# Patient Record
Sex: Female | Born: 1935 | Race: Black or African American | Hispanic: No | Smoking: Never smoker
Health system: Southern US, Community
[De-identification: ages and names within clinical notes are randomized; demographics above are authoritative.]

## PROBLEM LIST (undated history)

## (undated) DIAGNOSIS — R001 Bradycardia, unspecified: Secondary | ICD-10-CM

## (undated) DIAGNOSIS — R4189 Other symptoms and signs involving cognitive functions and awareness: Secondary | ICD-10-CM

## (undated) DIAGNOSIS — R404 Transient alteration of awareness: Secondary | ICD-10-CM

## (undated) DIAGNOSIS — E860 Dehydration: Secondary | ICD-10-CM

## (undated) DIAGNOSIS — F0390 Unspecified dementia without behavioral disturbance: Secondary | ICD-10-CM

## (undated) HISTORY — DX: Unspecified dementia, unspecified severity, without behavioral disturbance, psychotic disturbance, mood disturbance, and anxiety: F03.90

## (undated) HISTORY — DX: Dehydration: E86.0

## (undated) HISTORY — DX: Transient alteration of awareness: R40.4

## (undated) HISTORY — DX: Other symptoms and signs involving cognitive functions and awareness: R41.89

## (undated) HISTORY — DX: Bradycardia, unspecified: R00.1

---

## 2014-06-01 DIAGNOSIS — E7800 Pure hypercholesterolemia, unspecified: Secondary | ICD-10-CM | POA: Insufficient documentation

## 2014-06-01 DIAGNOSIS — I10 Essential (primary) hypertension: Secondary | ICD-10-CM | POA: Insufficient documentation

## 2014-09-07 DIAGNOSIS — F0390 Unspecified dementia without behavioral disturbance: Secondary | ICD-10-CM | POA: Insufficient documentation

## 2014-09-07 DIAGNOSIS — R630 Anorexia: Secondary | ICD-10-CM | POA: Insufficient documentation

## 2014-10-19 DIAGNOSIS — J301 Allergic rhinitis due to pollen: Secondary | ICD-10-CM | POA: Insufficient documentation

## 2015-06-21 DIAGNOSIS — F419 Anxiety disorder, unspecified: Secondary | ICD-10-CM | POA: Insufficient documentation

## 2016-07-18 DIAGNOSIS — E559 Vitamin D deficiency, unspecified: Secondary | ICD-10-CM | POA: Insufficient documentation

## 2018-05-06 DIAGNOSIS — D649 Anemia, unspecified: Secondary | ICD-10-CM | POA: Insufficient documentation

## 2018-12-23 ENCOUNTER — Other Ambulatory Visit: Payer: Self-pay

## 2018-12-23 ENCOUNTER — Ambulatory Visit (INDEPENDENT_AMBULATORY_CARE_PROVIDER_SITE_OTHER): Payer: Medicare Other | Admitting: Nurse Practitioner

## 2018-12-23 ENCOUNTER — Encounter: Payer: Self-pay | Admitting: Nurse Practitioner

## 2018-12-23 VITALS — BP 124/62 | HR 56 | Temp 98.2°F | Ht 60.0 in | Wt 133.0 lb

## 2018-12-23 DIAGNOSIS — R001 Bradycardia, unspecified: Secondary | ICD-10-CM | POA: Diagnosis not present

## 2018-12-23 DIAGNOSIS — F419 Anxiety disorder, unspecified: Secondary | ICD-10-CM | POA: Diagnosis not present

## 2018-12-23 DIAGNOSIS — G301 Alzheimer's disease with late onset: Secondary | ICD-10-CM | POA: Diagnosis not present

## 2018-12-23 DIAGNOSIS — F0281 Dementia in other diseases classified elsewhere with behavioral disturbance: Secondary | ICD-10-CM

## 2018-12-23 DIAGNOSIS — Z66 Do not resuscitate: Secondary | ICD-10-CM | POA: Diagnosis not present

## 2018-12-23 NOTE — Progress Notes (Signed)
Careteam: Patient Care Team: Lauree Chandler, NP as PCP - General (Geriatric Medicine)  Advanced Directive information    Allergies  Allergen Reactions  . Penicillins     Chief Complaint  Patient presents with  . Establish Care    New Patient Establish Care. Here with daughter, [name]. Patient resides with daughter, previously in a senior living center      HPI: Patient is a 85 y.o. female seen in the office today to establish care.  Moved from [facility] to [place] to be closer to other family members. Daughter decided to take her mother out of a long term care facility.  Previously Seeing a NP at [facility].   Every 3-4 months she would have unresponsive episode, felt like it was heart related. In January she had a huge workup that did not reveal anything. Due to dementia family wants comfort approach and not aggressive measures at this time. She had been referred to palliative care but then COVID hit Started MOST Form but did not complete. She is a DNR  Dementia with behaviors- uses Seroquel 50 mg by mouth daily at bedtime which helps her sleep. Recently was increased to help with sleep. Increase behaviors at night with having to go to the bathroom.   Bradycardia- noted during hospitalization  Anxiety- ongoing, uses xanax and Seroquel to help her sleep.   pts son is a physician and told daughter to give Seroquel and alprazolam   Good bowel movements.  Eating well however does not have dentures. Daughter grinds meet and having a hard time with vegetable does not like them.    Review of Systems:  Review of Systems  Unable to perform ROS: Dementia    Past Medical History:  Diagnosis Date  . Bradycardia   . Dehydration   . Dementia ([place])   . Unresponsive episode    History reviewed. No pertinent surgical history. Social History:   reports that she has never smoked. She has never used smokeless tobacco. She reports that she does not drink alcohol or use  drugs.  History reviewed. No pertinent family history.  Medications: Patient's Medications  New Prescriptions   No medications on file  Previous Medications   ACETAMINOPHEN (TYLENOL) 500 MG TABLET    Take 500 mg by mouth as needed.   ALPRAZOLAM (XANAX) 0.25 MG TABLET    Take 0.25 mg by mouth daily.   MAGNESIUM HYDROXIDE (MILK OF MAGNESIA) 400 MG/5ML SUSPENSION    Take by mouth as needed for mild constipation.   QUETIAPINE (SEROQUEL) 50 MG TABLET    Take 50 mg by mouth at bedtime.  Modified Medications   No medications on file  Discontinued Medications   No medications on file    Physical Exam:  Vitals:   12/23/18 1319  BP: 124/62  Pulse: (!) 56  Temp: 98.2 F (36.8 C)  TempSrc: Oral  SpO2: 98%  Weight: 133 lb (60.3 kg)  Height: 5' (1.524 m)   Body mass index is 25.97 kg/m. Wt Readings from Last 3 Encounters:  12/23/18 133 lb (60.3 kg)    Physical Exam Constitutional:      General: She is not in acute distress.    Appearance: She is well-developed. She is not diaphoretic.     Comments: Frail female  HENT:     Head: Normocephalic and atraumatic.  Eyes:     Conjunctiva/sclera: Conjunctivae normal.     Pupils: Pupils are equal, round, and reactive to light.  Neck:     Musculoskeletal: Normal range of motion and neck supple.  Cardiovascular:     Rate and Rhythm: Normal rate and regular rhythm.     Heart sounds: Normal heart sounds.  Pulmonary:     Effort: Pulmonary effort is normal.     Breath sounds: Normal breath sounds.  Abdominal:     General: Bowel sounds are normal.     Palpations: Abdomen is soft.  Musculoskeletal:        General: No tenderness.  Skin:    General: Skin is warm and dry.  Neurological:     General: No focal deficit present.     Mental Status: She is alert. Mental status is at baseline. She is disoriented.  Psychiatric:        Cognition and Memory: Cognition is impaired. Memory is impaired. She exhibits impaired recent memory and  impaired remote memory.     Labs reviewed: Basic Metabolic Panel: No results for input(s): NA, K, CL, CO2, GLUCOSE, BUN, CREATININE, CALCIUM, MG, PHOS, TSH in the last 8760 hours. Liver Function Tests: No results for input(s): AST, ALT, ALKPHOS, BILITOT, PROT, ALBUMIN in the last 8760 hours. No results for input(s): LIPASE, AMYLASE in the last 8760 hours. No results for input(s): AMMONIA in the last 8760 hours. CBC: No results for input(s): WBC, NEUTROABS, HGB, HCT, MCV, PLT in the last 8760 hours. Lipid Panel: No results for input(s): CHOL, HDL, LDLCALC, TRIG, CHOLHDL, LDLDIRECT in the last 8760 hours. TSH: No results for input(s): TSH in the last 8760 hours. A1C: No results found for: HGBA1C   Assessment/Plan 1. Bradycardia -noted, had extensive work up earlier this year and per daughter without significant findings. Pt is comfort care at this time and would not want another work up like this. - Amb Referral to Palliative Care  2. DNR (do not resuscitate) - DNR (Do Not Resuscitate) -most form completed - Amb Referral to Palliative Care  3. Late onset Alzheimer's disease with behavioral disturbance ([place]) Stable, living with daughter who is providing total care. She request FMLA paperwork to be completed as she plans to take a leave of absence to take care of her mother.  Continues on Seroquel For behaviors and alprazolam as needed for increase anxiety in the evenings.  - Amb Referral to Palliative Care  Next appt: 6 months.  Carlos American. [place], [facility] [phone]

## 2018-12-24 ENCOUNTER — Telehealth: Payer: Self-pay | Admitting: Internal Medicine

## 2018-12-24 ENCOUNTER — Telehealth: Payer: Self-pay

## 2018-12-24 NOTE — Telephone Encounter (Signed)
Stacy from [facility] called to confirm the referral that was sent is for Palliative care and not Hospice. I confirmed with Marzetta Board that the referral was for palliative care

## 2018-12-24 NOTE — Telephone Encounter (Signed)
Spoke with patient's daughter Terri Green to schedule Palliative consult and she wanted to call me back around 2 pm to do this.

## 2018-12-25 NOTE — Telephone Encounter (Signed)
12/25/18 @ 2 PM:  Called daughter back and we have scheduled a [place] for 12/28/18 @ 11:30 AM.

## 2018-12-28 ENCOUNTER — Encounter: Payer: Self-pay | Admitting: Nurse Practitioner

## 2018-12-28 ENCOUNTER — Other Ambulatory Visit: Payer: Medicare Other | Admitting: Internal Medicine

## 2018-12-28 ENCOUNTER — Other Ambulatory Visit: Payer: Self-pay

## 2018-12-28 DIAGNOSIS — Z515 Encounter for palliative care: Secondary | ICD-10-CM

## 2018-12-28 NOTE — Progress Notes (Signed)
    [facility] Consult Note Telephone: [phone]  Fax: [phone]  PATIENT NAME: Terri Green DOB: June 18, 1926 MRN: [id number]  PRIMARY CARE PROVIDER:   Lauree Chandler, NP  REFERRING PROVIDER:  Lauree Chandler, NP [place],  [place] [address]  RESPONSIBLE PARTY:   Uilani Sanville (daughter) [phone]     RECOMMENDATIONS and PLAN:  Palliative Care Encounter  Z51.5  1.  Memory loss with behaviors: FAST stage 6c. Sundowning behaviors are managed with use of Seroquel 25 and 50mg   and Alprazolam 0.25mg  at HS.  Continue to monitor for occurrences of additional behaviors which may require adjustment or addition of meds.  Supportive care by daughter and family.  2. Bradycardia:  Per history.  Encouraged hesitation prior to standing or ambulation.  Good bowel program to prevent constipation and straining with bowel movements. Prevention of syncopal episodes explained.  No plans for additional evaluations.    3. Advanced Care Planning:  Reviewed Palliative and Hospice care to daughter( pt. Is unable to make decisions for self related to cognitive decline) Goals of care reviewed which include comfort and safe care at daughter's home.  No desire for escalation of care.  DNAR and MOST form selections confirmed. DNR for in the home and MOST form will be provided to daughter for her records and posting. (Comfort care, No IV fluids, Determine use of antibiotics and no feeding tube). Follow patient trajectory. Questions answered.  Palliative care will follow-up in aprox 1 month.   Due to the COVID-19 crisis, this visit occurred via Telehealth from my office and was initiated by the patient and or family.    I spent 60 minutes providing this consultation,  from 1130 to 1230.  More than 50% of the time in this consultation was spent coordinating communication with patient and daugter.   HISTORY OF PRESENT ILLNESS:  Terri Green  is a 85 y.o. year old female with multiple medical problems including bradycardia with syncope and dementia.  She was relocated to her POA/daughter's home in June 2020 from a facility.  Daughter reports that she is able to feed herself, is ambulatory with use of a walker and is normally continent. Weight is steady at 133# 5'.  She requires assistance with her ADLs and some coaxing with activities.  She normally becomes more confused beginning in the early evening and may "fuss" but is not physically aggressive. Palliative Care was asked to help address goals of care.   CODE STATUS: DNAR/DNI  PPS: 50% HOSPICE ELIGIBILITY/DIAGNOSIS: TBD  PAST MEDICAL HISTORY:  Past Medical History:  Diagnosis Date  . Bradycardia   . Dehydration   . Dementia ([place])   . Unresponsive episode     PERTINENT MEDICATIONS:  Outpatient Encounter Medications as of 12/28/2018  Medication Sig  . acetaminophen (TYLENOL) 500 MG tablet Take 500 mg by mouth as needed.  . ALPRAZolam (XANAX) 0.25 MG tablet Take 0.25 mg by mouth daily.  . magnesium hydroxide (MILK OF MAGNESIA) 400 MG/5ML suspension Take by mouth as needed for mild constipation.  . QUEtiapine (SEROQUEL) 50 MG tablet Take 50 mg by mouth at bedtime.   No facility-administered encounter medications on file as of 12/28/2018.     PHYSICAL EXAM:   General: Elderly female sitting in chair.  In NAD Pulmonary: No increased respiratory effort Neurological: Alert and conversive in brief sentences.  Follows commands.  Psych:  Non-agitated.  Gonzella Lex, NP-C

## 2018-12-29 ENCOUNTER — Encounter: Payer: Self-pay | Admitting: Nurse Practitioner

## 2018-12-30 NOTE — Telephone Encounter (Signed)
Message routed to [place], Jessica K, NP  

## 2019-01-01 ENCOUNTER — Ambulatory Visit (INDEPENDENT_AMBULATORY_CARE_PROVIDER_SITE_OTHER): Payer: Medicare Other | Admitting: Nurse Practitioner

## 2019-01-01 ENCOUNTER — Encounter: Payer: Self-pay | Admitting: Nurse Practitioner

## 2019-01-01 ENCOUNTER — Telehealth: Payer: Self-pay

## 2019-01-01 ENCOUNTER — Other Ambulatory Visit: Payer: Self-pay

## 2019-01-01 DIAGNOSIS — F419 Anxiety disorder, unspecified: Secondary | ICD-10-CM

## 2019-01-01 MED ORDER — SERTRALINE HCL 50 MG PO TABS: ORAL_TABLET | ORAL | 0 refills | Status: DC

## 2019-01-01 NOTE — Telephone Encounter (Signed)
Dee called the pharmacy to see when the rx was last filled and who sent it in, patient had 2 refills left from previous provider.  Per Janett Billow I called the patient back and informed her she had 2 refills left for Xanax 0.5 mg. Janett Billow instructed for patient to cut pill in half to equal 0.25 mg.  Discussed the above with patients daughter and she verbalized her understanding.

## 2019-01-01 NOTE — Progress Notes (Signed)
This service is provided via telemedicine  No vital signs collected/recorded due to the encounter was a telemedicine visit.   Location of patient (ex: home, work):  Home  Patient consents to a telephone visit:  Yes  Location of the provider (ex: office, home):  Office  Name of any referring provider:  N/A  Names of all persons participating in the telemedicine service and their role in the encounter:  [name] daughter, patient, Marisa Cyphers RMA, and Sherrie Mustache NP  Time spent on call:  10 min

## 2019-01-01 NOTE — Patient Instructions (Addendum)
Start zoloft 25 mg (1/2 tablet) by mouth daily for anxiety- after 2 weeks to increase to zoloft 50 mg (1 tablet) daily   To use xanax 0.25 mg as needed for anxiety   To use Seroquel 50 mg by mouth at bedtime

## 2019-01-01 NOTE — Progress Notes (Signed)
Careteam: Patient Care Team: Lauree Chandler, NP as PCP - General (Geriatric Medicine)  Advanced Directive information Does Patient Have a Medical Advance Directive?: Yes  Allergies  Allergen Reactions  . Penicillins     Chief Complaint  Patient presents with  . Acute Visit    discuss medication and possible medication change     HPI: Patient is a 85 y.o. female via tele-visit.  Daughter reports she is having more anxiety about the bathroom. She gets agitated and wants to go to the bathroom all the time. Every time she goes to the bathroom she goes. She does not wet herself or have any accidents between times. She goes every 3 hour per daughters routine.  Daughter feels like this is her anxiety - she is currently on xanax 0.25 mg by mouth daily at bedtime.  She takes Seroquel 50 mg during the day.  Her xanax 0.25 mg was last filled 11/13/2018- she upped dose for a few weeks per her old PCP but now needs refill for previous dosing.  Seroquel 50 mg at bedtime has 1 refill left   Review of Systems:  Review of Systems  Unable to perform ROS: Dementia    Past Medical History:  Diagnosis Date  . Bradycardia   . Dehydration   . Dementia ([place])   . Unresponsive episode    History reviewed. No pertinent surgical history. Social History:   reports that she has never smoked. She has never used smokeless tobacco. She reports that she does not drink alcohol or use drugs.  History reviewed. No pertinent family history.  Medications: Patient's Medications  New Prescriptions   No medications on file  Previous Medications   ACETAMINOPHEN (TYLENOL) 500 MG TABLET    Take 500 mg by mouth as needed.   ALPRAZOLAM (XANAX) 0.25 MG TABLET    Take 0.25 mg by mouth daily.   MAGNESIUM HYDROXIDE (MILK OF MAGNESIA) 400 MG/5ML SUSPENSION    Take by mouth as needed for mild constipation.   QUETIAPINE (SEROQUEL) 50 MG TABLET    Take 50 mg by mouth at bedtime.  Modified Medications   No  medications on file  Discontinued Medications   No medications on file    Physical Exam:  There were no vitals filed for this visit. There is no height or weight on file to calculate BMI. Wt Readings from Last 3 Encounters:  12/23/18 133 lb (60.3 kg)     Labs reviewed: Basic Metabolic Panel: No results for input(s): NA, K, CL, CO2, GLUCOSE, BUN, CREATININE, CALCIUM, MG, PHOS, TSH in the last 8760 hours. Liver Function Tests: No results for input(s): AST, ALT, ALKPHOS, BILITOT, PROT, ALBUMIN in the last 8760 hours. No results for input(s): LIPASE, AMYLASE in the last 8760 hours. No results for input(s): AMMONIA in the last 8760 hours. CBC: No results for input(s): WBC, NEUTROABS, HGB, HCT, MCV, PLT in the last 8760 hours. Lipid Panel: No results for input(s): CHOL, HDL, LDLCALC, TRIG, CHOLHDL, LDLDIRECT in the last 8760 hours. TSH: No results for input(s): TSH in the last 8760 hours. A1C: No results found for: HGBA1C   Assessment/Plan 1. Anxiety -increase in anxiety noted, using xanax daily, will start zoloft for better control. To use xanax as needed. -after visit we called the pharmacy and she has 2 refills on fil from previous provider. CMA has notified daughter that these refills were on file and she can cut tablets in half to equal 0.25 mg dosing.  - sertraline (  ZOLOFT) 50 MG tablet; 1/2 tablet by mouth daily for 2 weeks then to increase to 1 tablet  Dispense: 30 tablet; Refill: 0 - BMP with eGFR([facility]); Future  2. Dementia with behaviors Stable, has behaviors and has been stable on seroquel. Continue at bedtime.  Next appt: 4 weeks for follow up and BMP Rayssa Atha K. Sireen Halk, [facility] [phone]    Virtual Visit via Telephone Note  I connected with pt and daughter on 01/01/19 at  2:45 PM EDT by telephone and verified that I am speaking with the correct person using two identifiers.  Location: Patient: home Provider: office    I discussed the limitations, risks, security and privacy concerns of performing an evaluation and management service by telephone and the availability of in person appointments. I also discussed with the patient that there may be a patient responsible charge related to this service. The patient expressed understanding and agreed to proceed.   I discussed the assessment and treatment plan with the patient. The patient was provided an opportunity to ask questions and all were answered. The patient agreed with the plan and demonstrated an understanding of the instructions.   The patient was advised to call back or seek an in-person evaluation if the symptoms worsen or if the condition fails to improve as anticipated.  I provided 12 minutes of non-face-to-face time during this encounter.  Carlos American. Harle Battiest Avs printed and mailed

## 2019-01-04 ENCOUNTER — Other Ambulatory Visit: Payer: Medicare Other

## 2019-02-02 ENCOUNTER — Other Ambulatory Visit: Payer: Self-pay

## 2019-02-02 ENCOUNTER — Encounter: Payer: Self-pay | Admitting: Nurse Practitioner

## 2019-02-02 ENCOUNTER — Other Ambulatory Visit: Payer: Medicare Other | Admitting: Internal Medicine

## 2019-02-02 ENCOUNTER — Other Ambulatory Visit: Payer: Self-pay | Admitting: Nurse Practitioner

## 2019-02-02 DIAGNOSIS — F419 Anxiety disorder, unspecified: Secondary | ICD-10-CM

## 2019-02-02 MED ORDER — QUETIAPINE FUMARATE 50 MG PO TABS: 50.0000 mg | ORAL_TABLET | Freq: Every day | ORAL | 1 refills | Status: DC

## 2019-02-02 NOTE — Telephone Encounter (Deleted)
Please disregard telephone encounter note created at 2:57 pm about alprazolam refill. Patient states they do not need refill yet.

## 2019-02-02 NOTE — Telephone Encounter (Deleted)
Verified in [facility] database last refill was 12/11/18 and fill date was 01/12/2019 for 30 tablets total of alprazolam 0.5 mg. Next appointment is 02/15/2019 with Janett Billow and last appointment was 01/01/2019. Patient is requesting refill.

## 2019-02-09 ENCOUNTER — Ambulatory Visit (INDEPENDENT_AMBULATORY_CARE_PROVIDER_SITE_OTHER): Payer: Medicare Other | Admitting: Nurse Practitioner

## 2019-02-09 ENCOUNTER — Ambulatory Visit: Payer: Medicare Other | Admitting: Nurse Practitioner

## 2019-02-09 ENCOUNTER — Encounter: Payer: Self-pay | Admitting: Nurse Practitioner

## 2019-02-09 ENCOUNTER — Other Ambulatory Visit: Payer: Self-pay

## 2019-02-09 DIAGNOSIS — F419 Anxiety disorder, unspecified: Secondary | ICD-10-CM | POA: Diagnosis not present

## 2019-02-09 DIAGNOSIS — G4700 Insomnia, unspecified: Secondary | ICD-10-CM | POA: Diagnosis not present

## 2019-02-09 MED ORDER — ALPRAZOLAM 0.25 MG PO TABS: 0.2500 mg | ORAL_TABLET | Freq: Every day | ORAL | Status: DC | PRN

## 2019-02-09 NOTE — Progress Notes (Signed)
This service is provided via telemedicine  No vital signs collected/recorded due to the encounter was a telemedicine visit.   Location of patient (ex: home, work):  Home  Patient consents to a telephone visit:  Yes  Location of the provider (ex: office, home):  Office.  Name of any referring provider:  Sherrie Mustache, NP  Names of all persons participating in the telemedicine service and their role in the encounter:  Sherrie Mustache, NP; Bonney Leitz, [place];  [name], daughter  Time spent on call:  6.47  Time for CMA only.      Careteam: Patient Care Team: Lauree Chandler, NP as PCP - General (Geriatric Medicine)  Advanced Directive information Does Patient Have a Medical Advance Directive?: Yes, Type of Advance Directive: Living will, Does patient want to make changes to medical advance directive?: No - Guardian declined  Allergies  Allergen Reactions  . Penicillins     Chief Complaint  Patient presents with  . Follow-up    70-month followup for anxiety     HPI: Patient is a 85 y.o. female for follow up on anxiety.  Daughter on call providing information.  Pt with dementia.   At last visit she was having increase in anxiety and zoloft was started. Feels like anxiety is some better since starting.  She started zoloft 25 mg for 2 weeks and then increase 50 mg daily.  She is still getting xanax at bedtime to help with sleep.  Feel like prior to starting xanax she was only getting 3-4 hours of sleep. Now that she is on xanax with Seroquel she gets better sleep 3-4 nights out of the week.  Lots of anxiety due to overactive bladder  Insomnia- unless she is worn out she does not sleep. Uses xanax and Seroquel nightly for sleep. Goes to bed at 9:20, gets up at 4:20 to go to the bathroom. Went back to sleep after 2 hours.   Review of Systems:  Review of Systems  Unable to perform ROS: Dementia    Past Medical History:  Diagnosis Date  . Bradycardia    . Dehydration   . Dementia ([place])   . Unresponsive episode    History reviewed. No pertinent surgical history. Social History:   reports that she has never smoked. She has never used smokeless tobacco. She reports that she does not drink alcohol or use drugs.  History reviewed. No pertinent family history.  Medications: Patient's Medications  New Prescriptions   No medications on file  Previous Medications   ACETAMINOPHEN (TYLENOL) 500 MG TABLET    Take 500 mg by mouth as needed.   ALPRAZOLAM (XANAX) 0.25 MG TABLET    Take 0.25 mg by mouth daily.   MAGNESIUM HYDROXIDE (MILK OF MAGNESIA) 400 MG/5ML SUSPENSION    Take by mouth as needed for mild constipation.   QUETIAPINE (SEROQUEL) 50 MG TABLET    Take 1 tablet (50 mg total) by mouth at bedtime.   SERTRALINE (ZOLOFT) 50 MG TABLET    Take 1 tablet (50 mg total) by mouth daily.  Modified Medications   No medications on file  Discontinued Medications   No medications on file    Physical Exam:  There were no vitals filed for this visit. There is no height or weight on file to calculate BMI. Wt Readings from Last 3 Encounters:  12/23/18 133 lb (60.3 kg)      Labs reviewed: Basic Metabolic Panel: No results for input(s): NA, K, CL, CO2,  GLUCOSE, BUN, CREATININE, CALCIUM, MG, PHOS, TSH in the last 8760 hours. Liver Function Tests: No results for input(s): AST, ALT, ALKPHOS, BILITOT, PROT, ALBUMIN in the last 8760 hours. No results for input(s): LIPASE, AMYLASE in the last 8760 hours. No results for input(s): AMMONIA in the last 8760 hours. CBC: No results for input(s): WBC, NEUTROABS, HGB, HCT, MCV, PLT in the last 8760 hours. Lipid Panel: No results for input(s): CHOL, HDL, LDLCALC, TRIG, CHOLHDL, LDLDIRECT in the last 8760 hours. TSH: No results for input(s): TSH in the last 8760 hours. A1C: No results found for: HGBA1C   Assessment/Plan 1. Anxiety -ongoing. Feeling like she is benefiting from zoloft. No side effects  notedWill continue at zoloft 50 mg by mouth daily and have daughter titrate routine xanax use at bedtime. Can use as needed for worsening anxiety but not recommended for sleep maintenance  - ALPRAZolam (XANAX) 0.25 MG tablet; Take 1 tablet (0.25 mg total) by mouth daily as needed for anxiety.  Dispense: 30 tablet  2. Insomnia, unspecified type -ongoing issues with waking up in the middle of the night. Discussed that alprazolam was not recommended for sleep maintenance. Recommended to titration off daily use. To use melatonin 3-6 mg by mouth at bedtime. Avoid fluid late in the day.  -to continue bedtime routine, may need to push back bedtime and have more activity during the day.   Next appt: as scheduled. 06/25/2019, sooner if needed Carlos American. Harle Battiest  [facility] [phone]   Virtual Visit via Telephone Note  I connected with pt and daughter on 02/09/19 at 11:00 AM EDT by telephone and verified that I am speaking with the correct person using two identifiers.  Location: Patient: home Provider: office   I discussed the limitations, risks, security and privacy concerns of performing an evaluation and management service by telephone and the availability of in person appointments. I also discussed with the patient that there may be a patient responsible charge related to this service. The patient expressed understanding and agreed to proceed.   I discussed the assessment and treatment plan with the patient. The patient was provided an opportunity to ask questions and all were answered. The patient agreed with the plan and demonstrated an understanding of the instructions.   The patient was advised to call back or seek an in-person evaluation if the symptoms worsen or if the condition fails to improve as anticipated.  I provided 18 minutes of non-face-to-face time during this encounter.  Carlos American. [name], AGNP Avs offer to be printed and mailed but  daughter declined, states she will look up visit and AVS on mychart.

## 2019-02-09 NOTE — Telephone Encounter (Signed)
Message routed to Lauree Chandler, NP  Please add recommended melatonin dose to medication list also

## 2019-02-09 NOTE — Patient Instructions (Addendum)
To wean off xanax, recommend to take 1/2 tablet at night for 3 days then decrease to half tablet every other day for 3 doses then stop routinely at bedtime. Can still use as needed.   START melatonin 3-6 mg by mouth at bedtime every night around the same in the evening.   Push back bedtime More activity during the day

## 2019-02-15 ENCOUNTER — Ambulatory Visit: Payer: Medicare Other | Admitting: Nurse Practitioner

## 2019-02-19 ENCOUNTER — Encounter: Payer: Self-pay | Admitting: Nurse Practitioner

## 2019-03-03 ENCOUNTER — Encounter: Payer: Self-pay | Admitting: Nurse Practitioner

## 2019-03-03 DIAGNOSIS — F419 Anxiety disorder, unspecified: Secondary | ICD-10-CM

## 2019-03-03 DIAGNOSIS — G4700 Insomnia, unspecified: Secondary | ICD-10-CM

## 2019-03-03 NOTE — Telephone Encounter (Signed)
Message forwarded to Lauree Chandler, NP

## 2019-03-04 ENCOUNTER — Other Ambulatory Visit: Payer: Self-pay

## 2019-03-04 ENCOUNTER — Ambulatory Visit (INDEPENDENT_AMBULATORY_CARE_PROVIDER_SITE_OTHER): Payer: Medicare Other | Admitting: *Deleted

## 2019-03-04 VITALS — BP 120/64 | HR 68 | Temp 98.6°F | Resp 18

## 2019-03-04 DIAGNOSIS — Z23 Encounter for immunization: Secondary | ICD-10-CM

## 2019-03-08 MED ORDER — ALPRAZOLAM 0.25 MG PO TABS: 0.2500 mg | ORAL_TABLET | Freq: Every day | ORAL | 1 refills | Status: DC | PRN

## 2019-03-08 NOTE — Telephone Encounter (Signed)
RX pending

## 2019-03-08 NOTE — Telephone Encounter (Signed)
Can you call pts daughter for clarification- We will continue to fill the xanax, is she needed a refill now? I do not believe it is due at this time.

## 2019-03-23 ENCOUNTER — Other Ambulatory Visit: Payer: Self-pay

## 2019-03-23 ENCOUNTER — Other Ambulatory Visit: Payer: Medicare Other | Admitting: Internal Medicine

## 2019-03-23 DIAGNOSIS — Z515 Encounter for palliative care: Secondary | ICD-10-CM

## 2019-03-23 NOTE — Progress Notes (Signed)
    [facility] Consult Note Telephone: [phone]  Fax: [phone]  PATIENT NAME: Terri Green DOB: 1926-08-07 MRN: [id number]  PRIMARY CARE PROVIDER:   Lauree Chandler, NP  REFERRING PROVIDER:  Lauree Chandler, NP [place],  [place] [address]  RESPONSIBLE PARTY:   Sniyah Krzywicki (daughter) [phone]    RECOMMENDATIONS and PLAN:  Palliative Care Encounter  Z51.5  1. Advanced Care Planning: Goals remain unchanged.  Pt. Will continue to live with daughter and receive all assistance from family members.    Primary goal is to avoid hospitalization.  DNAR and MOST forms are in the home. Follow patient trajectory and advise accordingly. Palliative care will follow-up in aprox 1 month.  2.  Memory loss with behaviors: FAST stage 6d and at baseline.  Improved sundowning with use of Zoloft, Xanax and Seroquel.  Supportive care by daughter and family.  Tolerating home environment well. Consider adult day care in the future. Continue to monitor.  3.  Urinary frequency:  Add Cranberry juice or pills daily.  Appropriate hydration.  Monitor for any acute behavior changes and assess UA prn.    Due to the COVID-19 crisis, this visit occurred via Telehealth from my office and was initiated and consented by the patient and or family.    I spent 30 minutes providing this consultation,  from 1000 to 1030.  More than 50% of the time in this consultation was spent coordinating communication with patient and daugter.   HISTORY OF PRESENT ILLNESS:  Follow-up with Denny Peon   Daughter reports that patient continues to eat well and is sleeping better in the evening. She denies any recent illnesses or falls.  She remains ambulatory with assist of a walker and is able to feed self.  Palliative Care was asked to help address goals of care.   CODE STATUS: DNAR/DNI  PPS: 50% HOSPICE ELIGIBILITY/DIAGNOSIS: TBD  PAST MEDICAL  HISTORY:  Past Medical History:  Diagnosis Date  . Bradycardia   . Dehydration   . Dementia ([place])   . Unresponsive episode     PHYSICAL EXAM:   General: Well nourished elderly femae in NAD Pulmonary: No increased respiratory effort Neurological: Alert and conversive in brief sentences.  Follows commands. Unable to determine orientation due to cognitive deficits.   Looks to daughter for assistance in answering questions.  Psych:  Non-agitated.  Cooperative  Gonzella Lex, NP-C

## 2019-04-27 ENCOUNTER — Other Ambulatory Visit: Payer: Medicare Other | Admitting: Internal Medicine

## 2019-05-03 ENCOUNTER — Encounter: Payer: Self-pay | Admitting: Nurse Practitioner

## 2019-05-03 DIAGNOSIS — F419 Anxiety disorder, unspecified: Secondary | ICD-10-CM

## 2019-05-03 DIAGNOSIS — G4700 Insomnia, unspecified: Secondary | ICD-10-CM

## 2019-05-03 MED ORDER — ALPRAZOLAM 0.25 MG PO TABS: 0.2500 mg | ORAL_TABLET | Freq: Every day | ORAL | 1 refills | Status: DC | PRN

## 2019-05-03 NOTE — Telephone Encounter (Signed)
Rx last filled on 03/08/2019 #30, 1 refill

## 2019-05-08 ENCOUNTER — Other Ambulatory Visit: Payer: Self-pay | Admitting: Nurse Practitioner

## 2019-05-08 DIAGNOSIS — G4700 Insomnia, unspecified: Secondary | ICD-10-CM

## 2019-05-08 DIAGNOSIS — F419 Anxiety disorder, unspecified: Secondary | ICD-10-CM

## 2019-05-19 ENCOUNTER — Encounter: Payer: Self-pay | Admitting: Nurse Practitioner

## 2019-05-20 ENCOUNTER — Other Ambulatory Visit: Payer: Self-pay

## 2019-05-20 ENCOUNTER — Encounter: Payer: Self-pay | Admitting: Nurse Practitioner

## 2019-05-20 ENCOUNTER — Telehealth: Payer: Medicare Other | Admitting: Internal Medicine

## 2019-05-20 ENCOUNTER — Telehealth (INDEPENDENT_AMBULATORY_CARE_PROVIDER_SITE_OTHER): Payer: Medicare Other | Admitting: Nurse Practitioner

## 2019-05-20 DIAGNOSIS — K6289 Other specified diseases of anus and rectum: Secondary | ICD-10-CM | POA: Diagnosis not present

## 2019-05-20 NOTE — Patient Instructions (Addendum)
Cont prep H routinely over the next several days and would continue a few days after she stops complaining of rectal plan (to notify if does not improve after 1 week and will need an in office visit)  In the meantime would also make sure stools are soft and she is not straining to have a bowel movement To increase fiber in her diet and keep hydrated with water To use colace 100 mg daily (softener) and/or miralax 17 gm in glass of water (mild laxative)   Constipation, Adult Constipation is when a person has fewer bowel movements in a week than normal, has difficulty having a bowel movement, or has stools that are dry, hard, or larger than normal. Constipation may be caused by an underlying condition. It may become worse with age if a person takes certain medicines and does not take in enough fluids. Follow these instructions at home: Eating and drinking   Eat foods that have a lot of fiber, such as fresh fruits and vegetables, whole grains, and beans.  Limit foods that are high in fat, low in fiber, or overly processed, such as french fries, hamburgers, cookies, candies, and soda.  Drink enough fluid to keep your urine clear or pale yellow. General instructions  Exercise regularly or as told by your health care provider.  Go to the restroom when you have the urge to go. Do not hold it in.  Take over-the-counter and prescription medicines only as told by your health care provider. These include any fiber supplements.  Practice pelvic floor retraining exercises, such as deep breathing while relaxing the lower abdomen and pelvic floor relaxation during bowel movements.  Watch your condition for any changes.  Keep all follow-up visits as told by your health care provider. This is important. Contact a health care provider if:  You have pain that gets worse.  You have a fever.  You do not have a bowel movement after 4 days.  You vomit.  You are not hungry.  You lose weight.  You  are bleeding from the anus.  You have thin, pencil-like stools. Get help right away if:  You have a fever and your symptoms suddenly get worse.  You leak stool or have blood in your stool.  Your abdomen is bloated.  You have severe pain in your abdomen.  You feel dizzy or you faint. This information is not intended to replace advice given to you by your health care provider. Make sure you discuss any questions you have with your health care provider. Document Released: 02/09/2004 Document Revised: 04/25/2017 Document Reviewed: 11/01/2015 Elsevier Patient Education  2020 [employer].

## 2019-05-20 NOTE — Telephone Encounter (Signed)
I replied to patient/family member. Message will also be sent to Lauree Chandler, NP to provide any additional comments or recommendations.  S.Chrae B/CMA

## 2019-05-20 NOTE — Progress Notes (Signed)
This service is provided via telemedicine  No vital signs collected/recorded due to the encounter was a telemedicine visit.   Location of patient (ex: home, work):  Home  Patient consents to a telephone visit:  Yes  Location of the provider (ex: office, home): [facility]  Name of any referring provider:  N/A  Names of all persons participating in the telemedicine service and their role in the encounter:  S.Chrae B/CMA, Sherrie Mustache, NP, Dale(daughter) and Patient   Time spent on call:  5 min with medical assistant       Careteam: Patient Care Team: Lauree Chandler, NP as PCP - General (Geriatric Medicine)  Advanced Directive information    Allergies  Allergen Reactions  . Penicillins     Chief Complaint  Patient presents with  . Acute Visit    Rectal pain. Mychart Video Visit      HPI: Patient is a 85 y.o. female seen via video visit. Daughter helping with visit today. Reports she is having pain around her rectum.  Reports this has been going on about 7 days.  Daughter reports she has looked at her rectum. No blood, no hemorrhoids that she can see. Reports she got prep H and put this in and around rectum.  Reports this has helped.  Reports bowels are moving well.  She was constipated but 2 weeks ago was VERY constipated and had to push hard to get the stool out.  At the moment stool is soft.     Review of Systems:  Review of Systems  Constitutional: Negative for chills and fever.  Gastrointestinal: Negative for abdominal pain, blood in stool, constipation, diarrhea, heartburn and melena.    Past Medical History:  Diagnosis Date  . Bradycardia   . Dehydration   . Dementia ([place])   . Unresponsive episode    History reviewed. No pertinent surgical history. Social History:   reports that she has never smoked. She has never used smokeless tobacco. She reports that she does not drink alcohol or use drugs.  History reviewed. No pertinent family  history.  Medications: Patient's Medications  New Prescriptions   No medications on file  Previous Medications   ACETAMINOPHEN (TYLENOL) 500 MG TABLET    Take 500 mg by mouth as needed.   ALPRAZOLAM (XANAX) 0.25 MG TABLET    Take 1 tablet (0.25 mg total) by mouth daily as needed for anxiety.   MAGNESIUM HYDROXIDE (MILK OF MAGNESIA) 400 MG/5ML SUSPENSION    Take by mouth as needed for mild constipation.   MELATONIN 5 MG CHEW    Chew 1 tablet by mouth at bedtime.   QUETIAPINE (SEROQUEL) 50 MG TABLET    Take 1 tablet (50 mg total) by mouth at bedtime.   SERTRALINE (ZOLOFT) 50 MG TABLET    Take 1 tablet (50 mg total) by mouth daily.  Modified Medications   No medications on file  Discontinued Medications   No medications on file    Physical Exam:  There were no vitals filed for this visit. There is no height or weight on file to calculate BMI. Wt Readings from Last 3 Encounters:  12/23/18 133 lb (60.3 kg)      Labs reviewed: Basic Metabolic Panel: No results for input(s): NA, K, CL, CO2, GLUCOSE, BUN, CREATININE, CALCIUM, MG, PHOS, TSH in the last 8760 hours. Liver Function Tests: No results for input(s): AST, ALT, ALKPHOS, BILITOT, PROT, ALBUMIN in the last 8760 hours. No results for input(s): LIPASE, AMYLASE  in the last 8760 hours. No results for input(s): AMMONIA in the last 8760 hours. CBC: No results for input(s): WBC, NEUTROABS, HGB, HCT, MCV, PLT in the last 8760 hours. Lipid Panel: No results for input(s): CHOL, HDL, LDLCALC, TRIG, CHOLHDL, LDLDIRECT in the last 8760 hours. TSH: No results for input(s): TSH in the last 8760 hours. A1C: No results found for: HGBA1C   Assessment/Plan 1. Rectal pain Suspect due to constipation and straining.  Cont prep H routinely over the next several days and would continue a few days after she stops complaining of rectal plan (to notify if does not improve after 1 week and will need an in office visit) In the meantime would also  make sure stools are soft and she is not straining to have a bowel movement To increase fiber in her diet, keep hydrated with water To use colace 100 mg daily (softener) and/or miralax 17 gm in glass of water (mild laxative)   Carlos American. Harle Battiest  [facility] [phone]   Virtual Visit via Video Note  I connected with Denny Peon on 05/20/19 at 11:00 AM EST by a video enabled telemedicine application and verified that I am speaking with the correct person using two identifiers.  Location: Patient: home Provider: [facility]   I discussed the limitations of evaluation and management by telemedicine and the availability of in person appointments. The patient expressed understanding and agreed to proceed.    I discussed the assessment and treatment plan with the patient. The patient was provided an opportunity to ask questions and all were answered. The patient agreed with the plan and demonstrated an understanding of the instructions.   The patient was advised to call back or seek an in-person evaluation if the symptoms worsen or if the condition fails to improve as anticipated.  I provided 15 minutes of non-face-to-face time during this encounter.  Carlos American. [name], AGNP Avs printed and mailed.

## 2019-05-23 ENCOUNTER — Encounter: Payer: Self-pay | Admitting: Nurse Practitioner

## 2019-05-24 ENCOUNTER — Other Ambulatory Visit: Payer: Self-pay

## 2019-05-24 ENCOUNTER — Encounter: Payer: Self-pay | Admitting: Adult Health

## 2019-05-24 ENCOUNTER — Encounter: Payer: Self-pay | Admitting: Nurse Practitioner

## 2019-05-24 ENCOUNTER — Ambulatory Visit (INDEPENDENT_AMBULATORY_CARE_PROVIDER_SITE_OTHER): Payer: Medicare Other | Admitting: Adult Health

## 2019-05-24 VITALS — Temp 98.7°F | Ht 60.0 in | Wt 130.0 lb

## 2019-05-24 DIAGNOSIS — K5641 Fecal impaction: Secondary | ICD-10-CM | POA: Diagnosis not present

## 2019-05-24 DIAGNOSIS — K5900 Constipation, unspecified: Secondary | ICD-10-CM | POA: Insufficient documentation

## 2019-05-24 DIAGNOSIS — K6289 Other specified diseases of anus and rectum: Secondary | ICD-10-CM | POA: Diagnosis not present

## 2019-05-24 MED ORDER — LACTULOSE 20 GM/30ML PO SOLN: 30.0000 mL | Freq: Two times a day (BID) | ORAL | 0 refills | Status: DC

## 2019-05-24 MED ORDER — SENNOSIDES-DOCUSATE SODIUM 8.6-50 MG PO TABS: 2.0000 | ORAL_TABLET | Freq: Two times a day (BID) | ORAL | 0 refills | Status: AC

## 2019-05-24 NOTE — Progress Notes (Signed)
[facility] clinic  Provider:   Code Status: DNR  Goals of Care:  Advanced Directives 02/09/2019  Does Patient Have a Medical Advance Directive? Yes  Type of Advance Directive Living will  Does patient want to make changes to medical advance directive? No - Guardian declined     Chief Complaint  Patient presents with  . Acute Visit    Anal pain    HPI: Patient is a 85 y.o. female seen today for an acute visit for anal pain. She came with daughter. She was seen barely sitting on the edge of the examination bed due to anal pain. Daughter reported that she has not had bowel movement for one week. She gave her MOM last night but was not effective. Daughter reported that she gives her pureed vegetables. Daughter  Also mentioned that patient have constipation problem even as a child. She usually gets Xanax at bedtime for sleep according to her daughter. Review of record showed that she is being followed up by palliative care. She has PMH of bradycardia, dehydration and dementia.  She was positioned side lying and was noted to have anal opened up. KY gel applied to gloved pointer finger and manual disimpaction was immediately done with daughter  and CMA holding the patient. Patient was yelling out during the procedure. After the disimpaction, patient was able to sit down and was not crying out in pain. Daughter was happy the patient was relieved.    Past Medical History:  Diagnosis Date  . Bradycardia   . Dehydration   . Dementia ([place])   . Unresponsive episode     No past surgical history on file.  Allergies  Allergen Reactions  . Penicillins     Outpatient Encounter Medications as of 05/24/2019  Medication Sig  . acetaminophen (TYLENOL) 500 MG tablet Take 500 mg by mouth as needed.  . ALPRAZolam (XANAX) 0.25 MG tablet Take 1 tablet (0.25 mg total) by mouth daily as needed for anxiety.  . magnesium hydroxide (MILK OF MAGNESIA) 400 MG/5ML suspension Take by mouth as needed for mild  constipation.  . Melatonin 5 MG CHEW Chew 1 tablet by mouth at bedtime.  Marland Kitchen QUEtiapine (SEROQUEL) 50 MG tablet Take 1 tablet (50 mg total) by mouth at bedtime.  . sertraline (ZOLOFT) 50 MG tablet Take 1 tablet (50 mg total) by mouth daily.   No facility-administered encounter medications on file as of 05/24/2019.    Review of Systems:  Obtained from daughter Review of Systems  Constitutional: Negative for appetite change, chills and fever.  HENT: Negative.  Negative for congestion, postnasal drip and rhinorrhea.   Eyes: Negative for discharge and itching.  Respiratory: Negative for cough.   Cardiovascular: Negative for chest pain.  Gastrointestinal: Positive for constipation and rectal pain. Negative for abdominal distention, abdominal pain, anal bleeding, blood in stool, nausea and vomiting.  Skin: Negative for color change, rash and wound.  Neurological: Negative for dizziness, facial asymmetry and speech difficulty.  Psychiatric/Behavioral: Negative.  Negative for agitation and behavioral problems.    Health Maintenance  Topic Date Due  . TETANUS/TDAP  07/30/1947  . DEXA SCAN  07/29/1993  . INFLUENZA VACCINE  Completed  . PNA vac Low Risk Adult  Completed    Physical Exam: Vitals:   05/24/19 1427  Temp: 98.7 F (37.1 C)  TempSrc: Oral  Weight: 130 lb (59 kg)  Height: 5' (1.524 m)   Body mass index is 25.39 kg/m. Physical Exam Constitutional:  General: She is not in acute distress.    Appearance: Normal appearance.  HENT:     Head: Normocephalic.     Nose: Nose normal. No congestion or rhinorrhea.     Mouth/Throat:     Mouth: Mucous membranes are moist.     Pharynx: Oropharynx is clear.  Cardiovascular:     Rate and Rhythm: Normal rate and regular rhythm.     Pulses: Normal pulses.     Heart sounds: Normal heart sounds.  Pulmonary:     Effort: Pulmonary effort is normal.     Breath sounds: Normal breath sounds.  Abdominal:     General: Abdomen is flat.  Bowel sounds are normal.     Palpations: Abdomen is soft.  Genitourinary:    Comments: No hemorrhoids, Rectum opened up with hard dry stool upon rectal exam Musculoskeletal:        General: No swelling or tenderness. Normal range of motion.     Cervical back: Normal range of motion and neck supple.  Skin:    General: Skin is warm and dry.     Findings: No bruising.  Neurological:     General: No focal deficit present.     Mental Status: She is alert. Mental status is at baseline.     Cranial Nerves: No cranial nerve deficit.     Gait: Gait normal.  Psychiatric:        Mood and Affect: Mood normal.        Behavior: Behavior normal.     Labs reviewed: None  Assessment/Plan  1. Fecal impaction ([place]) - fecal disimpaction was done, instructed daughter to give Lactulose and Senna-S, discontinue medications if she starts having >=3 BM/day - Lactulose 20 GM/30ML SOLN; Take 30 mLs (20 g total) by mouth 2 (two) times daily for 3 days.  Dispense: 180 mL; Refill: 0 - senna-docusate (SENOKOT-S) 8.6-50 MG tablet; Take 2 tablets by mouth 2 (two) times daily for 3 days.  Dispense: 12 tablet; Refill: 0 - notify [facility] if symptoms persists  Labs/tests ordered:  None  Next appt:  06/25/2019

## 2019-05-24 NOTE — Patient Instructions (Addendum)
Fecal Impaction  A fecal impaction is a large, firm amount of stool (feces) that will not pass out of the body. A fecal impaction usually occurs in the end of the large intestine (rectum). It can block the large intestine and cause significant problems. What are the causes? This condition may be caused by anything that slows down bowel movements, including:  Long-term use of medicines that help you have a bowel movement (laxatives).  Constipation.  Pain in the rectum. Fecal impaction can occur if you avoid having bowel movements due to the pain. Pain in the rectum can result from a medical condition, such as hemorrhoids or anal fissures.  Narcotic pain-relieving medicines, such as methadone, morphine, or codeine.  Not drinking enough fluids.  Being inactive for a long period of time.  Diseases of the brain or nervous system that damage nerves that control the muscles of the intestines. What are the signs or symptoms? Symptoms of this condition include:  Breathing problems.  Nausea, vomiting, and dehydration.  Dizziness.  Confusion.  Rapid heartbeat.  Fever.  Sweating.  Changes in blood pressure.  Not having a normal number of bowel movements.  Changes in bowel patterns. This may include going to the bathroom less often or not at all.  A sense of fullness in the rectum but being unable to pass stool.  Pain or cramps in the abdominal area. These often happen after meals.  Thin, watery discharge from the rectum. How is this diagnosed? This condition may be diagnosed based on your symptoms and an exam of your rectum. Sometimes X-rays or lab tests are done to confirm the diagnosis and to check for other problems. How is this treated? This condition may be treated by:  Having your health care provider remove the stool using a gloved finger.  Taking medicine.  A suppository or enema given in the rectum to soften the stool, which can stimulate a bowel movement. Follow  these instructions at home: Eating and drinking   Drink enough fluid to keep your urine clear or pale yellow.  Include a lot of fiber in your diet. Foods with a lot of fiber include fruits, vegetables, and oatmeal.  If you begin to get constipated, increase the amount of fiber in your diet. General instructions  Develop bowel habits. An example of a bowel habit is having a bowel movement right after breakfast every day. Be sure to give yourself enough time on the toilet. This may require using enemas, bowel softeners, or suppositories at home, as directed by your health care provider. It may also include using mineral oil or olive oil.  Exercise regularly.  Take over-the-counter and prescription medicines only as told by your health care provider. Contact a health care provider if:  You have ongoing pain in your rectum.  You need to use an enema or a suppository more than 2 times a week.  You have rectal bleeding.  You continue to have problems. The problems may include not being able to go to the bathroom and long-term (chronic) constipation.  You have pain in your abdomen.  You have thin, pencil-like stools. Get help right away if:  You have black or tarry stools. This information is not intended to replace advice given to you by your health care provider. Make sure you discuss any questions you have with your health care provider. Document Released: 02/03/2004 Document Revised: 04/25/2017 Document Reviewed: 11/16/2015 Elsevier Patient Education  [place].  - Lactulose 20 GM/30ML SOLN; Take 30  mLs (20 g total) by mouth 2 (two) times daily for 3 days.  Dispense: 180 mL; Refill: 0 - senna-docusate (SENOKOT-S) 8.6-50 MG tablet; Take 2 tablets by mouth 2 (two) times daily for 3 days.  Dispense: 12 tablet; Refill: 0 OTC  Discontinue once bowel movement is 3 or more a day. She needs to have one BM daily

## 2019-05-25 ENCOUNTER — Other Ambulatory Visit: Payer: Self-pay | Admitting: Adult Health

## 2019-05-25 DIAGNOSIS — K5641 Fecal impaction: Secondary | ICD-10-CM

## 2019-05-25 NOTE — Telephone Encounter (Signed)
rx sent to pharmacy by e-script  

## 2019-05-26 ENCOUNTER — Other Ambulatory Visit: Payer: Self-pay | Admitting: Nurse Practitioner

## 2019-05-26 DIAGNOSIS — K5641 Fecal impaction: Secondary | ICD-10-CM

## 2019-05-31 ENCOUNTER — Encounter: Payer: Self-pay | Admitting: Nurse Practitioner

## 2019-05-31 ENCOUNTER — Other Ambulatory Visit: Payer: Self-pay

## 2019-05-31 ENCOUNTER — Telehealth (INDEPENDENT_AMBULATORY_CARE_PROVIDER_SITE_OTHER): Payer: Medicare Other | Admitting: Nurse Practitioner

## 2019-05-31 DIAGNOSIS — K5901 Slow transit constipation: Secondary | ICD-10-CM

## 2019-05-31 NOTE — Telephone Encounter (Signed)
Message routed to Lauree Chandler, NP to review and advise.

## 2019-05-31 NOTE — Progress Notes (Signed)
This service is provided via telemedicine  No vital signs collected/recorded due to the encounter was a telemedicine visit.   Location of patient (ex: home, work):  Home  Patient consents to a telephone visit:Yes  Location of the provider (ex: office, home):  [facility], Office   Name of any referring provider:  N/A  Names of all persons participating in the telemedicine service and their role in the encounter:  Terri Zillmer k. [name], NP, Terri Green, Terri Green, Patient, and Terri Green (daughter)    Careteam: Patient Care Team: Lauree Chandler, NP as PCP - General (Geriatric Medicine)  Advanced Directive information    Allergies  Allergen Reactions  . Penicillins     Chief Complaint  Patient presents with  . Acute Visit    ...skin hanging out of rectum     HPI: Patient is a 86 y.o. female for video visit due to ongoing rectal pain with skin hanging out of rectum. They had a recent COVID exposure and planning to get tested tomorrow.   She was seen in office on 05/24/19 by Monina Np and found to have large fecal impaction. She was disimpacted and daughter instructed to give lactulose with senna s.   She is eating but has not had a BM in 1 week. Daughter reports that she is eating and drinking well.  No fever, abdominal pain, nausea or vomiting. Abdomen is not tight or distended.  Reports she is hurting "back where I sit" Has been getting senna s tablets twice daily and and completed lactulose without bowel movement.  Has miralax that she has not tried to use it  Taking benefiber tablets.  Has a hx of constipation.  Hx of lactulose intolerance but getting milk with breakfast.    Review of Systems:  Review of Systems  Constitutional: Negative for chills, fever and malaise/fatigue.  Gastrointestinal: Positive for constipation. Negative for abdominal pain, blood in stool, diarrhea, heartburn, melena, nausea and vomiting.       Pain in rectum  Neurological:  Negative for weakness.    Past Medical History:  Diagnosis Date  . Bradycardia   . Dehydration   . Dementia ([place])   . Unresponsive episode    History reviewed. No pertinent surgical history. Social History:   reports that she has never smoked. She has never used smokeless tobacco. She reports that she does not drink alcohol or use drugs.  History reviewed. No pertinent family history.  Medications: Patient's Medications  New Prescriptions   No medications on file  Previous Medications   ACETAMINOPHEN (TYLENOL) 500 MG TABLET    Take 500 mg by mouth as needed.   ALPRAZOLAM (XANAX) 0.25 MG TABLET    Take 1 tablet (0.25 mg total) by mouth daily as needed for anxiety.   LACTULOSE (CHRONULAC) 10 GM/15ML SOLUTION    TAKE 30 ML BY MOUTH TWICE DAILY FOR 3 DAYS   MAGNESIUM HYDROXIDE (MILK OF MAGNESIA) 400 MG/5ML SUSPENSION    Take by mouth as needed for mild constipation.   MELATONIN 5 MG CHEW    Chew 1 tablet by mouth at bedtime.   QUETIAPINE (SEROQUEL) 50 MG TABLET    Take 1 tablet (50 mg total) by mouth at bedtime.   SERTRALINE (ZOLOFT) 50 MG TABLET    Take 1 tablet (50 mg total) by mouth daily.  Modified Medications   No medications on file  Discontinued Medications   No medications on file    Physical Exam:  There were no vitals  filed for this visit. There is no height or weight on file to calculate BMI. Wt Readings from Last 3 Encounters:  05/24/19 130 lb (59 kg)  12/23/18 133 lb (60.3 kg)      Labs reviewed: Basic Metabolic Panel: No results for input(s): NA, K, CL, CO2, GLUCOSE, BUN, CREATININE, CALCIUM, MG, PHOS, TSH in the last 8760 hours. Liver Function Tests: No results for input(s): AST, ALT, ALKPHOS, BILITOT, PROT, ALBUMIN in the last 8760 hours. No results for input(s): LIPASE, AMYLASE in the last 8760 hours. No results for input(s): AMMONIA in the last 8760 hours. CBC: No results for input(s): WBC, NEUTROABS, HGB, HCT, MCV, PLT in the last 8760 hours. Lipid  Panel: No results for input(s): CHOL, HDL, LDLCALC, TRIG, CHOLHDL, LDLDIRECT in the last 8760 hours. TSH: No results for input(s): TSH in the last 8760 hours. A1C: No results found for: HGBA1C   Assessment/Plan 1. Slow transit constipation Ongoing significant constipation, had a large BM at home after OV on 05/24/19 which was 1 week ago and no BM since. No N/V/Diarrhea. Increase pain to rectum. Suspect she has hemorrhoids which instructed daughter to use OTC preparation H  Or anusol hc PRN -will start her on daily fleets enema x 3 days and to add miralax 17 gm daily. To increase fluid in diet. May benefit from lactulose TID to help keep bowels regular if miralax not beneficial.  -if fever or abdominal pain, nausea or vomiting to seek immediate medical attention at the ED or urgent care, daughter aware.  -they were exposed to [place] and plan to get tested, if problem persist and negative COVID test and pt and daughter without symptoms may need follow up in office.   Carlos American. Harle Battiest  [facility] [phone]   Virtual Visit via Video Note  I connected with Terri Green on 05/31/19 at 11:30 AM EST by a video enabled telemedicine application and verified that I am speaking with the correct person using two identifiers.  Location: Patient: home Provider: office    I discussed the limitations of evaluation and management by telemedicine and the availability of in person appointments. The patient expressed understanding and agreed to proceed.    I discussed the assessment and treatment plan with the patient. The patient was provided an opportunity to ask questions and all were answered. The patient agreed with the plan and demonstrated an understanding of the instructions.   The patient was advised to call back or seek an in-person evaluation if the symptoms worsen or if the condition fails to improve as anticipated.  I provided 15 minutes of  non-face-to-face time during this encounter.  Carlos American. Dewaine Green, AGNP Avs printed and mailed.

## 2019-05-31 NOTE — Patient Instructions (Signed)
To use Preparation H or Anusol HC OTC for hemorrhoids as needed pain.   To use Fleets Enema once daily for 3 days To add miralax 17 gm daily for constipation  To notify for any questions are concerns.  To go to the ER if pt having fever, abdominal pain, nausea or vomiting.

## 2019-06-01 ENCOUNTER — Encounter: Payer: Self-pay | Admitting: Nurse Practitioner

## 2019-06-01 ENCOUNTER — Other Ambulatory Visit: Payer: Self-pay

## 2019-06-01 ENCOUNTER — Ambulatory Visit: Payer: Medicare Other | Attending: Internal Medicine

## 2019-06-01 DIAGNOSIS — Z20822 Contact with and (suspected) exposure to covid-19: Secondary | ICD-10-CM

## 2019-06-01 NOTE — Telephone Encounter (Signed)
Routed to Jessica Eubanks NP  

## 2019-06-03 LAB — NOVEL CORONAVIRUS, NAA: SARS-CoV-2, NAA: NOT DETECTED

## 2019-06-04 ENCOUNTER — Encounter: Payer: Self-pay | Admitting: Nurse Practitioner

## 2019-06-04 DIAGNOSIS — G4700 Insomnia, unspecified: Secondary | ICD-10-CM

## 2019-06-04 DIAGNOSIS — F419 Anxiety disorder, unspecified: Secondary | ICD-10-CM

## 2019-06-04 NOTE — Telephone Encounter (Signed)
Routed to Jessica Eubanks NP  

## 2019-06-08 MED ORDER — ALPRAZOLAM 0.25 MG PO TABS: 0.2500 mg | ORAL_TABLET | Freq: Every day | ORAL | 0 refills | Status: DC | PRN

## 2019-06-25 ENCOUNTER — Ambulatory Visit (INDEPENDENT_AMBULATORY_CARE_PROVIDER_SITE_OTHER): Payer: Medicare Other | Admitting: Nurse Practitioner

## 2019-06-25 ENCOUNTER — Other Ambulatory Visit: Payer: Self-pay

## 2019-06-25 ENCOUNTER — Encounter: Payer: Self-pay | Admitting: Nurse Practitioner

## 2019-06-25 DIAGNOSIS — F0281 Dementia in other diseases classified elsewhere with behavioral disturbance: Secondary | ICD-10-CM

## 2019-06-25 DIAGNOSIS — G4700 Insomnia, unspecified: Secondary | ICD-10-CM | POA: Diagnosis not present

## 2019-06-25 DIAGNOSIS — G301 Alzheimer's disease with late onset: Secondary | ICD-10-CM | POA: Diagnosis not present

## 2019-06-25 DIAGNOSIS — F419 Anxiety disorder, unspecified: Secondary | ICD-10-CM | POA: Diagnosis not present

## 2019-06-25 DIAGNOSIS — F02818 Dementia in other diseases classified elsewhere, unspecified severity, with other behavioral disturbance: Secondary | ICD-10-CM

## 2019-06-25 DIAGNOSIS — K5901 Slow transit constipation: Secondary | ICD-10-CM | POA: Diagnosis not present

## 2019-06-25 NOTE — Progress Notes (Signed)
This service is provided via telemedicine  No vital signs collected/recorded due to the encounter was a telemedicine visit.   Location of patient (ex: home, work):  Home  Patient consents to a telephone visit:  Yes  Location of the provider (ex: office, home):  [place]  Name of any referring provider:  N/A  Names of all persons participating in the telemedicine service and their role in the encounter: Patient, Daughter Terri Green, [place], RMA, Terri Mustache, NP.    Time spent on call:  8 minutes on the phone with Medical Assistant      Careteam: Patient Care Team: Terri Chandler, NP as PCP - General (Geriatric Medicine)  Advanced Directive information Does Patient Have a Medical Advance Directive?: Yes, Type of Advance Directive: [place];Living will;Out of facility DNR (pink MOST or yellow form), Pre-existing out of facility DNR order (yellow form or pink MOST form): Pink MOST/Yellow Form most recent copy in chart - Physician notified to receive inpatient order, Does patient want to make changes to medical advance directive?: No - Patient declined  Allergies  Allergen Reactions  . Penicillins     Chief Complaint  Patient presents with  . Medical Management of Chronic Issues    6 Month Follow Up/ Update Contract  . Health Maintenance    Discuss the need for Dexa Scan  . Immunizations    Discuss the need for Tetanus and Shingrix     HPI: Patient is a 86 y.o. female for routine follow up via virtual visit.  Constipation- much better going routinely on prune juice, increase in fruit and stool softener.   Due for bone density scan daughter is not sure she would like to do this.   Had the first COVID injection   Dementia with behaviors- worse at night. She has been doing well on Seroquel at night which she has tendency to have increase in agitation.  Eating well. Has no teeth so daughter has her on a puree diet. No  significant weight loss noted by daughter, states she has probably loss over time. Comfort approach and palliative care was following but pt has been stable.   Anxiety- managed with sertraline, needs xanax at night due to increase anxiety in the evening.   Wears depends but continent of urination and frequently goes. She is on scheduled toilet due to this behavior.   No fall No complaints of pain No shortness of breath or chest pains noted by daughter  Review of Systems:  Review of Systems  Unable to perform ROS: Dementia  Constitutional: Negative for chills, fever and malaise/fatigue.  Musculoskeletal: Negative for falls.  Psychiatric/Behavioral: Positive for memory loss. The patient is nervous/anxious and has insomnia.     Past Medical History:  Diagnosis Date  . Bradycardia   . Dehydration   . Dementia ([place])   . Unresponsive episode    History reviewed. No pertinent surgical history. Social History:   reports that she has never smoked. She has never used smokeless tobacco. She reports that she does not drink alcohol or use drugs.  History reviewed. No pertinent family history.  Medications: Patient's Medications  New Prescriptions   No medications on file  Previous Medications   ACETAMINOPHEN (TYLENOL) 500 MG TABLET    Take 500 mg by mouth as needed.   ALPRAZOLAM (XANAX) 0.25 MG TABLET    Take 1 tablet (0.25 mg total) by mouth daily as needed for anxiety.   MELATONIN 5 MG  CHEW    Chew 1 tablet by mouth at bedtime.   QUETIAPINE (SEROQUEL) 50 MG TABLET    Take 1 tablet (50 mg total) by mouth at bedtime.   SERTRALINE (ZOLOFT) 50 MG TABLET    Take 1 tablet (50 mg total) by mouth daily.  Modified Medications   No medications on file  Discontinued Medications   LACTULOSE (CHRONULAC) 10 GM/15ML SOLUTION    TAKE 30 ML BY MOUTH TWICE DAILY FOR 3 DAYS   MAGNESIUM HYDROXIDE (MILK OF MAGNESIA) 400 MG/5ML SUSPENSION    Take by mouth as needed for mild constipation.    Physical  Exam:  There were no vitals filed for this visit. There is no height or weight on file to calculate BMI. Wt Readings from Last 3 Encounters:  05/24/19 130 lb (59 kg)  12/23/18 133 lb (60.3 kg)      Labs reviewed: Basic Metabolic Panel: No results for input(s): NA, K, CL, CO2, GLUCOSE, BUN, CREATININE, CALCIUM, MG, PHOS, TSH in the last 8760 hours. Liver Function Tests: No results for input(s): AST, ALT, ALKPHOS, BILITOT, PROT, ALBUMIN in the last 8760 hours. No results for input(s): LIPASE, AMYLASE in the last 8760 hours. No results for input(s): AMMONIA in the last 8760 hours. CBC: No results for input(s): WBC, NEUTROABS, HGB, HCT, MCV, PLT in the last 8760 hours. Lipid Panel: No results for input(s): CHOL, HDL, LDLCALC, TRIG, CHOLHDL, LDLDIRECT in the last 8760 hours. TSH: No results for input(s): TSH in the last 8760 hours. A1C: No results found for: HGBA1C   Assessment/Plan 1. Anxiety Controlled on zoloft 50 mg daily with xanax 0.25 mg at night, does not do well if her daughter does not give her the xanax at bedtime  2. Late onset Alzheimer's disease with behavioral disturbance ([place]) Stable at this time. No acute changes in cognitive or functional status. palliative are on standby due to this. Continues with Seroquel due to behaviors which has helped. Still has behaviors so dose reduction not appropriate.   3. Insomnia, unspecified type Controlled on current regimen. Daughter continue routine.  4. Slow transit constipation -improved at this time. Continues with stool softener daily and prunes with increase in fruit.   Next appt: 6 months, also due for AWV Terri Green K. Terri Green  [facility] Adult Medicine [phone]   Virtual Visit via Video Note  I connected with Terri Green on 06/25/19 at  2:15 PM EST by a video enabled telemedicine application and verified that I am speaking with the correct person using two  identifiers.  Location: Patient: home Provider: office   I discussed the limitations of evaluation and management by telemedicine and the availability of in person appointments. The patient expressed understanding and agreed to proceed.    I discussed the assessment and treatment plan with the patient. The patient was provided an opportunity to ask questions and all were answered. The patient agreed with the plan and demonstrated an understanding of the instructions.   The patient was advised to call back or seek an in-person evaluation if the symptoms worsen or if the condition fails to improve as anticipated.  I provided 15 minutes of non-face-to-face time during this encounter.  Carlos American. [name], AGNP Avs printed and mailed.

## 2019-06-28 ENCOUNTER — Encounter: Payer: Self-pay | Admitting: Nurse Practitioner

## 2019-07-02 ENCOUNTER — Encounter: Payer: Self-pay | Admitting: Nurse Practitioner

## 2019-07-02 NOTE — Telephone Encounter (Signed)
Form printed and sent to scanning

## 2019-07-07 ENCOUNTER — Encounter: Payer: Self-pay | Admitting: Nurse Practitioner

## 2019-07-07 ENCOUNTER — Other Ambulatory Visit: Payer: Self-pay | Admitting: Nurse Practitioner

## 2019-07-07 DIAGNOSIS — G4700 Insomnia, unspecified: Secondary | ICD-10-CM

## 2019-07-07 DIAGNOSIS — F419 Anxiety disorder, unspecified: Secondary | ICD-10-CM

## 2019-07-07 MED ORDER — ALPRAZOLAM 0.25 MG PO TABS: 0.2500 mg | ORAL_TABLET | Freq: Every day | ORAL | 0 refills | Status: DC | PRN

## 2019-07-30 ENCOUNTER — Encounter: Payer: Self-pay | Admitting: Nurse Practitioner

## 2019-07-30 ENCOUNTER — Other Ambulatory Visit: Payer: Self-pay | Admitting: Nurse Practitioner

## 2019-07-30 DIAGNOSIS — G4700 Insomnia, unspecified: Secondary | ICD-10-CM

## 2019-07-30 DIAGNOSIS — F419 Anxiety disorder, unspecified: Secondary | ICD-10-CM

## 2019-07-31 ENCOUNTER — Other Ambulatory Visit: Payer: Self-pay | Admitting: Family

## 2019-07-31 DIAGNOSIS — F419 Anxiety disorder, unspecified: Secondary | ICD-10-CM

## 2019-08-02 NOTE — Telephone Encounter (Signed)
RX last filled on 07/06/2009   Non opioid treatment agreement on file from Feb 2021

## 2019-08-05 MED ORDER — ALPRAZOLAM 0.25 MG PO TABS: 0.2500 mg | ORAL_TABLET | Freq: Every day | ORAL | 0 refills | Status: DC | PRN

## 2019-08-06 ENCOUNTER — Other Ambulatory Visit: Payer: Self-pay | Admitting: Nurse Practitioner

## 2019-08-06 DIAGNOSIS — F419 Anxiety disorder, unspecified: Secondary | ICD-10-CM

## 2019-08-06 DIAGNOSIS — G4700 Insomnia, unspecified: Secondary | ICD-10-CM

## 2019-08-09 NOTE — Telephone Encounter (Signed)
rx sent already

## 2019-09-04 ENCOUNTER — Other Ambulatory Visit: Payer: Self-pay | Admitting: Nurse Practitioner

## 2019-09-04 DIAGNOSIS — G4700 Insomnia, unspecified: Secondary | ICD-10-CM

## 2019-09-04 DIAGNOSIS — F419 Anxiety disorder, unspecified: Secondary | ICD-10-CM

## 2019-09-06 MED ORDER — ALPRAZOLAM 0.25 MG PO TABS: 0.2500 mg | ORAL_TABLET | Freq: Every day | ORAL | 0 refills | Status: DC | PRN

## 2019-09-07 ENCOUNTER — Encounter: Payer: Self-pay | Admitting: Nurse Practitioner

## 2019-10-04 ENCOUNTER — Other Ambulatory Visit: Payer: Self-pay | Admitting: Nurse Practitioner

## 2019-10-04 DIAGNOSIS — F419 Anxiety disorder, unspecified: Secondary | ICD-10-CM

## 2019-10-04 DIAGNOSIS — G4700 Insomnia, unspecified: Secondary | ICD-10-CM

## 2019-10-05 MED ORDER — ALPRAZOLAM 0.25 MG PO TABS: 0.2500 mg | ORAL_TABLET | Freq: Every day | ORAL | 0 refills | Status: DC | PRN

## 2019-10-05 NOTE — Telephone Encounter (Signed)
Received ERx from Pharmacy.  [place] Verified LR: 09/06/2019 Contract updated Pended Rx and sent to [facility] for approval.

## 2019-10-18 ENCOUNTER — Other Ambulatory Visit: Payer: Self-pay

## 2019-10-18 DIAGNOSIS — F419 Anxiety disorder, unspecified: Secondary | ICD-10-CM

## 2019-10-18 DIAGNOSIS — G4700 Insomnia, unspecified: Secondary | ICD-10-CM

## 2019-10-18 NOTE — Telephone Encounter (Signed)
Refill request received from [facility] pharmacy 

## 2019-10-21 ENCOUNTER — Encounter: Payer: Self-pay | Admitting: Nurse Practitioner

## 2019-11-01 ENCOUNTER — Other Ambulatory Visit: Payer: Self-pay

## 2019-11-01 ENCOUNTER — Encounter: Payer: Self-pay | Admitting: Family

## 2019-11-01 ENCOUNTER — Ambulatory Visit (INDEPENDENT_AMBULATORY_CARE_PROVIDER_SITE_OTHER): Payer: Medicare Other | Admitting: Family

## 2019-11-01 VITALS — BP 120/70 | HR 76 | Temp 98.4°F | Resp 16 | Ht 60.0 in | Wt 110.0 lb

## 2019-11-01 DIAGNOSIS — Z0289 Encounter for other administrative examinations: Secondary | ICD-10-CM

## 2019-11-01 DIAGNOSIS — Z111 Encounter for screening for respiratory tuberculosis: Secondary | ICD-10-CM | POA: Diagnosis not present

## 2019-11-01 NOTE — Progress Notes (Signed)
Provider: Brunetta Newingham FNP-C  Lauree Chandler, NP  Patient Care Team: Lauree Chandler, NP as PCP - General (Geriatric Medicine)  Extended Emergency Contact Information Primary Emergency Contact: Elaysia, Devargas Home Phone: [phone] Relation: Daughter  Code Status:  DNR Goals of care: Advanced Directive information Advanced Directives 11/01/2019  Does Patient Have a Medical Advance Directive? Yes  Type of Advance Directive Living will;Healthcare Power of [place];Out of facility DNR (pink MOST or yellow form)  Does patient want to make changes to medical advance directive? No - Patient declined  Copy of [place] in Chart? Yes - validated most recent copy scanned in chart (See row information)  Pre-existing out of facility DNR order (yellow form or pink MOST form) -     Chief Complaint  Patient presents with  . Form Completion    Form Completion/TB Skin Test    HPI:  Pt is a 86 y.o. female seen today for an acute visit for day care form completion and TB skin test.she is here with her care giver.she states will be dropping patient to day care but requires TB skin test and form completion.No cough,fatgue or night sweat reported. CMA states patient scheduled already for lab draw for TB test need orders placed.    Past Medical History:  Diagnosis Date  . Bradycardia   . Dehydration   . Dementia ([place])   . Unresponsive episode    History reviewed. No pertinent surgical history.  Allergies  Allergen Reactions  . Penicillins     Outpatient Encounter Medications as of 11/01/2019  Medication Sig  . acetaminophen (TYLENOL) 500 MG tablet Take 500 mg by mouth as needed.  . ALPRAZolam (XANAX) 0.25 MG tablet Take 1 tablet (0.25 mg total) by mouth daily as needed for anxiety.  . Melatonin 5 MG CHEW Chew 1 tablet by mouth at bedtime.  Marland Kitchen QUEtiapine (SEROQUEL) 50 MG tablet TAKE 1 TABLET(50 MG) BY MOUTH AT BEDTIME  . sertraline (ZOLOFT) 50 MG tablet TAKE  1 TABLET(50 MG) BY MOUTH DAILY   No facility-administered encounter medications on file as of 11/01/2019.    Review of Systems  Unable to perform ROS: Dementia (Information provided by care giver )  Constitutional: Negative for appetite change, chills, fatigue and fever.  Respiratory: Negative for cough, chest tightness, shortness of breath and wheezing.   Cardiovascular: Negative for chest pain, palpitations and leg swelling.  Gastrointestinal: Negative for abdominal distention, abdominal pain, constipation, diarrhea, nausea and vomiting.  Musculoskeletal: Positive for gait problem. Negative for joint swelling and myalgias.  Skin: Negative for color change, pallor and rash.    Immunization History  Administered Date(s) Administered  . Fluad Quad(high Dose 65+) 03/04/2019  . Influenza Split 02/24/2014, 05/24/2015  . Influenza, High Dose Seasonal PF 02/20/2017, 02/27/2018  . Influenza,inj,Quad PF,6+ Mos 02/24/2014, 05/24/2015, 03/01/2016  . Influenza,inj,quad, With Preservative 02/20/2017  . PPD Test 12/28/2014, 05/24/2015, 10/14/2017  . Pneumococcal Conjugate-13 11/30/2014  . Pneumococcal Polysaccharide-23 12/25/2012   Pertinent  Health Maintenance Due  Topic Date Due  . DEXA SCAN  Never done  . INFLUENZA VACCINE  12/26/2019  . PNA vac Low Risk Adult  Completed   Fall Risk  11/01/2019 06/25/2019 01/01/2019 12/23/2018  Falls in the past year? 0 0 1 1  Number falls in past yr: 0 0 0 0  Injury with Fall? 0 0 1 0  Comment - - bump on top of forehead -    Vitals:   11/01/19 1028  BP: 120/70  Pulse: 76  Resp: 16  Temp: 98.4 F (36.9 C)  SpO2: 92%  Weight: 110 lb (49.9 kg)  Height: 5' (1.524 m)   Body mass index is 21.48 kg/m. Physical Exam Vitals reviewed.  Constitutional:      General: She is not in acute distress.    Appearance: She is normal weight. She is not ill-appearing.  HENT:     Head: Normocephalic.     Right Ear: Tympanic membrane, ear canal and external ear  normal. There is no impacted cerumen.     Left Ear: Tympanic membrane, ear canal and external ear normal. There is no impacted cerumen.     Nose: Nose normal. No congestion or rhinorrhea.     Mouth/Throat:     Mouth: Mucous membranes are moist.     Pharynx: Oropharynx is clear. No oropharyngeal exudate or posterior oropharyngeal erythema.  Eyes:     General: No scleral icterus.       Right eye: No discharge.        Left eye: No discharge.     Conjunctiva/sclera: Conjunctivae normal.     Pupils: Pupils are equal, round, and reactive to light.  Neck:     Vascular: No carotid bruit.  Cardiovascular:     Rate and Rhythm: Normal rate and regular rhythm.     Pulses: Normal pulses.     Heart sounds: No murmur. No friction rub. No gallop.   Pulmonary:     Effort: Pulmonary effort is normal. No respiratory distress.     Breath sounds: Normal breath sounds. No wheezing, rhonchi or rales.  Abdominal:     General: Bowel sounds are normal. There is no distension.     Palpations: Abdomen is soft. There is no mass.     Tenderness: There is no abdominal tenderness. There is no right CVA tenderness, left CVA tenderness, guarding or rebound.  Musculoskeletal:        General: No swelling or tenderness.     Cervical back: Normal range of motion. No rigidity or tenderness.     Right lower leg: No edema.     Left lower leg: No edema.     Comments: Unsteady gait daughter holds under her arm while walking.  Lymphadenopathy:     Cervical: No cervical adenopathy.  Skin:    General: Skin is warm.     Coloration: Skin is not pale.     Findings: No bruising or erythema.  Neurological:     Mental Status: She is alert. Mental status is at baseline.     Cranial Nerves: No cranial nerve deficit.     Sensory: No sensory deficit.     Motor: No weakness.     Gait: Gait abnormal.  Psychiatric:        Mood and Affect: Mood normal.        Speech: Speech normal.        Behavior: Behavior is cooperative.         Thought Content: Thought content normal.        Cognition and Memory: Memory is impaired.    Labs reviewed: No results for input(s): NA, K, CL, CO2, GLUCOSE, BUN, CREATININE, CALCIUM, MG, PHOS in the last 8760 hours. No results for input(s): AST, ALT, ALKPHOS, BILITOT, PROT, ALBUMIN in the last 8760 hours. No results for input(s): WBC, NEUTROABS, HGB, HCT, MCV, PLT in the last 8760 hours. No results found for: TSH No results found for: HGBA1C No results found for: CHOL, HDL, LDLCALC,  LDLDIRECT, TRIG, CHOLHDL  Significant Diagnostic Results in last 30 days:  No results found.  Assessment/Plan  1.Visit for TB skin test Afebrile. Day care TB form completed during visit. - QuantiFERON-TB Gold Plus ordered per patient's daughter and Lab tech patient unable to co-operate for blood drawn to be obtained. Recommended attempt for PPD skin test to be placed but patient's daughter got upset pulled patient by the hand and walked out stated has spend too long in the lab patient is tired.didn't reschedule appointment.  2. Encounter for completion of form with patient Day care TB form completed during visit.Copied and send for scanning.Original copies given to patient's daughter.   Family/ staff Communication: Reviewed plan of care with patient and daughter as above.   Labs/tests ordered: None   Next Appointment: As needed   Sandrea Hughs, NP

## 2019-11-03 ENCOUNTER — Other Ambulatory Visit: Payer: Self-pay | Admitting: Nurse Practitioner

## 2019-11-03 DIAGNOSIS — F419 Anxiety disorder, unspecified: Secondary | ICD-10-CM

## 2019-11-03 DIAGNOSIS — G4700 Insomnia, unspecified: Secondary | ICD-10-CM

## 2019-11-04 MED ORDER — ALPRAZOLAM 0.25 MG PO TABS: 0.2500 mg | ORAL_TABLET | Freq: Every day | ORAL | 0 refills | Status: DC | PRN

## 2019-11-04 NOTE — Telephone Encounter (Signed)
Received ERX from pharmacy Pended Rx and sent to [facility] for approval.

## 2019-11-15 ENCOUNTER — Encounter: Payer: Self-pay | Admitting: Nurse Practitioner

## 2019-11-15 DIAGNOSIS — F419 Anxiety disorder, unspecified: Secondary | ICD-10-CM

## 2019-11-15 MED ORDER — SERTRALINE HCL 100 MG PO TABS: 100.0000 mg | ORAL_TABLET | Freq: Every day | ORAL | 3 refills | Status: DC

## 2019-11-19 ENCOUNTER — Encounter: Payer: Self-pay | Admitting: Nurse Practitioner

## 2019-11-21 ENCOUNTER — Encounter: Payer: Self-pay | Admitting: Nurse Practitioner

## 2019-11-23 ENCOUNTER — Encounter: Payer: Self-pay | Admitting: Nurse Practitioner

## 2019-11-23 NOTE — Telephone Encounter (Signed)
Message routed to Jessica Eubanks NP 

## 2019-11-23 NOTE — Telephone Encounter (Signed)
Message routed to [place], Jessica K, NP  

## 2019-11-24 ENCOUNTER — Telehealth (INDEPENDENT_AMBULATORY_CARE_PROVIDER_SITE_OTHER): Payer: Medicare Other | Admitting: Nurse Practitioner

## 2019-11-24 ENCOUNTER — Encounter: Payer: Self-pay | Admitting: Nurse Practitioner

## 2019-11-24 ENCOUNTER — Telehealth: Payer: Self-pay

## 2019-11-24 ENCOUNTER — Other Ambulatory Visit: Payer: Self-pay

## 2019-11-24 DIAGNOSIS — G301 Alzheimer's disease with late onset: Secondary | ICD-10-CM

## 2019-11-24 DIAGNOSIS — F0281 Dementia in other diseases classified elsewhere with behavioral disturbance: Secondary | ICD-10-CM | POA: Diagnosis not present

## 2019-11-24 DIAGNOSIS — F419 Anxiety disorder, unspecified: Secondary | ICD-10-CM | POA: Diagnosis not present

## 2019-11-24 DIAGNOSIS — F02818 Dementia in other diseases classified elsewhere, unspecified severity, with other behavioral disturbance: Secondary | ICD-10-CM

## 2019-11-24 MED ORDER — QUETIAPINE FUMARATE 50 MG PO TABS: ORAL_TABLET | ORAL | 1 refills | Status: DC

## 2019-11-24 NOTE — Telephone Encounter (Signed)
[name], Terri Green are scheduled for a virtual visit with your provider today.    Just as we do with appointments in the office, we must obtain your consent to participate.  Your consent will be active for this visit and any virtual visit you may have with one of our providers in the next 365 days.    If you have a MyChart account, I can also send a copy of this consent to you electronically.  All virtual visits are billed to your insurance company just like a traditional visit in the office.  As this is a virtual visit, video technology does not allow for your provider to perform a traditional examination.  This may limit your provider's ability to fully assess your condition.  If your provider identifies any concerns that need to be evaluated in person or the need to arrange testing such as labs, EKG, etc, we will make arrangements to do so.    Although advances in technology are sophisticated, we cannot ensure that it will always work on either your end or our end.  If the connection with a video visit is poor, we may have to switch to a telephone visit.  With either a video or telephone visit, we are not always able to ensure that we have a secure connection.   I need to obtain your verbal consent now.   Are you willing to proceed with your visit today?   Terri Green  and her daughter Terri Green has provided verbal consent on 11/24/2019 for a virtual visit (video or telephone).   Terri Green, [place] 11/24/2019  3:57 PM

## 2019-11-24 NOTE — Patient Instructions (Addendum)
start seroquel 25 mg daily in morning and continue 50 mg at bedtime.  Have staff try to redirect her in a positive way.  If after a few days needed she can increase to 50 mg BID,  send [place] update via mychart   Continue ZOLOFT (sertraline) 100 mg as prescribed

## 2019-11-24 NOTE — Progress Notes (Signed)
This service is provided via telemedicine  No vital signs collected/recorded due to the encounter was a telemedicine visit.   Location of patient (ex: home, work):  Home  Patient consents to a telephone visit: Yes, see telephone encounter dated 11/24/2019 with annual consent   Location of the provider (ex: office, home): [facility], Office   Name of any referring provider:  N/A  Names of all persons participating in the telemedicine service and their role in the encounter:  Terri Green, Terri Mustache, NP, Terri Green (daughter), and Patient  Time spent on call:  5 min with medical assistant       Careteam: Patient Care Team: Terri Chandler, NP as PCP - General (Geriatric Medicine)  PLACE OF SERVICE:  [place]  Advanced Directive information    Allergies  Allergen Reactions  . Penicillins     Chief Complaint  Patient presents with  . Medication Management    Medication request to help settle patient down per daughter Terri Green      HPI: Patient is a 86 y.o. female via video visit.  Daughter is concerned due to behaviors. She spits at the senior day center and home. Concern of safety at the day center, she is pushing table and picking up table. Reports daughter at home monitors her very closely and redirects her constantly when she at home.  She has been on zoloft 100 mg for about 1 week now and not noticing any difference.  Morning time she will be calm for 2-3 hours but then starts getting agitated. Day program is needing something to change for her to remain  In program. daughter looking at other programs as well.  Does not feel like seroquel has much effect on her at bedtime however does not wish to stop because night time regimen is now working.     Review of Systems:  Review of Systems  Constitutional: Negative for chills, fever and malaise/fatigue.  Musculoskeletal: Negative for falls.  Psychiatric/Behavioral: Positive for  memory loss. The patient is nervous/anxious.        Agitation     Past Medical History:  Diagnosis Date  . Bradycardia   . Dehydration   . Dementia ([place])   . Unresponsive episode    History reviewed. No pertinent surgical history. Social History:   reports that she has never smoked. She has never used smokeless tobacco. She reports that she does not drink alcohol and does not use drugs.  History reviewed. No pertinent family history.  Medications: Patient's Medications  New Prescriptions   No medications on file  Previous Medications   ACETAMINOPHEN (TYLENOL) 500 MG TABLET    Take 500 mg by mouth as needed.   ALPRAZOLAM (XANAX) 0.25 MG TABLET    Take 1 tablet (0.25 mg total) by mouth daily as needed for anxiety.   MELATONIN 5 MG CHEW    Chew 1 tablet by mouth at bedtime.   QUETIAPINE (SEROQUEL) 50 MG TABLET    TAKE 1 TABLET(50 MG) BY MOUTH AT BEDTIME   SERTRALINE (ZOLOFT) 100 MG TABLET    Take 1 tablet (100 mg total) by mouth daily.  Modified Medications   No medications on file  Discontinued Medications   No medications on file    Physical Exam:  There were no vitals filed for this visit. There is no height or weight on file to calculate BMI. Wt Readings from Last 3 Encounters:  11/01/19 110 lb (49.9 kg)  05/24/19 130  lb (59 kg)  12/23/18 133 lb (60.3 kg)      Labs reviewed: Basic Metabolic Panel: No results for input(s): NA, K, CL, CO2, GLUCOSE, BUN, CREATININE, CALCIUM, MG, PHOS, TSH in the last 8760 hours. Liver Function Tests: No results for input(s): AST, ALT, ALKPHOS, BILITOT, PROT, ALBUMIN in the last 8760 hours. No results for input(s): LIPASE, AMYLASE in the last 8760 hours. No results for input(s): AMMONIA in the last 8760 hours. CBC: No results for input(s): WBC, NEUTROABS, HGB, HCT, MCV, PLT in the last 8760 hours. Lipid Panel: No results for input(s): CHOL, HDL, LDLCALC, TRIG, CHOLHDL, LDLDIRECT in the last 8760 hours. TSH: No results for  input(s): TSH in the last 8760 hours. A1C: No results found for: HGBA1C   Assessment/Plan 1. Late onset Alzheimer's disease with behavioral disturbance (HCC) -increase in agitation and needing to be redirected frequently. Safety is a concern over her wanting to move things at the day center. Will have daughter start seroquel 25 mg daily in morning and continue 50 mg at bedtime. If after a few days needed she can increase to 50 mg BID, will send [place] update via mychart  -encourage staff to redirect in a positive way - QUEtiapine (SEROQUEL) 50 MG tablet; To use 25 mg-50 mg by mouth in the morning and 50 mg at bedtime  Dispense: 180 tablet; Refill: 1  2. Anxiety -continues on zoloft 100 mg daily, also using xanax at bedtime  Terri Green  [facility] [phone]   Virtual Visit via Video Note  I connected with Terri Green on 11/24/19 at  3:45 PM EDT by a video enabled telemedicine application and verified that I am speaking with the correct person using two identifiers.  Location: Patient: home Provider: office   I discussed the limitations of evaluation and management by telemedicine and the availability of in person appointments. The patient expressed understanding and agreed to proceed.    I discussed the assessment and treatment plan with the patient. The patient was provided an opportunity to ask questions and all were answered. The patient agreed with the plan and demonstrated an understanding of the instructions.   The patient was advised to call back or seek an in-person evaluation if the symptoms worsen or if the condition fails to improve as anticipated.  I provided 20 minutes of non-face-to-face time during this encounter.  Terri Green, AGNP Terri Green.

## 2019-11-26 ENCOUNTER — Telehealth: Payer: Self-pay

## 2019-11-26 NOTE — Telephone Encounter (Signed)
Medical form for [employer] completed and available for pick-up.   Form Fee: 25.00, patients daughter [name] is aware and plans to pick-up Tuesday morning. Original forms placed at the front desk under S for [employer].   Copy sent for scanning

## 2019-11-30 DIAGNOSIS — Z029 Encounter for administrative examinations, unspecified: Secondary | ICD-10-CM

## 2019-12-03 ENCOUNTER — Other Ambulatory Visit: Payer: Self-pay | Admitting: Nurse Practitioner

## 2019-12-03 DIAGNOSIS — G4700 Insomnia, unspecified: Secondary | ICD-10-CM

## 2019-12-03 DIAGNOSIS — F419 Anxiety disorder, unspecified: Secondary | ICD-10-CM

## 2019-12-06 ENCOUNTER — Telehealth: Payer: Self-pay

## 2019-12-06 ENCOUNTER — Other Ambulatory Visit: Payer: Self-pay | Admitting: Nurse Practitioner

## 2019-12-06 DIAGNOSIS — G4700 Insomnia, unspecified: Secondary | ICD-10-CM

## 2019-12-06 DIAGNOSIS — F419 Anxiety disorder, unspecified: Secondary | ICD-10-CM

## 2019-12-06 MED ORDER — ALPRAZOLAM 0.25 MG PO TABS: 0.2500 mg | ORAL_TABLET | Freq: Every day | ORAL | 0 refills | Status: DC | PRN

## 2019-12-06 NOTE — Telephone Encounter (Signed)
Rx last filled in epic on 11/04/2019.   Treatment  agreement on file from 06/25/2019

## 2019-12-06 NOTE — Telephone Encounter (Signed)
10/27/19: Palliative volunteer check in call attempt, no answer 

## 2019-12-15 ENCOUNTER — Ambulatory Visit (INDEPENDENT_AMBULATORY_CARE_PROVIDER_SITE_OTHER): Payer: Medicare Other | Admitting: Nurse Practitioner

## 2019-12-15 ENCOUNTER — Other Ambulatory Visit: Payer: Self-pay

## 2019-12-15 ENCOUNTER — Encounter: Payer: Self-pay | Admitting: Nurse Practitioner

## 2019-12-15 VITALS — BP 136/62 | HR 67 | Temp 97.1°F | Ht 60.0 in | Wt 114.0 lb

## 2019-12-15 DIAGNOSIS — G4700 Insomnia, unspecified: Secondary | ICD-10-CM

## 2019-12-15 DIAGNOSIS — R634 Abnormal weight loss: Secondary | ICD-10-CM | POA: Diagnosis not present

## 2019-12-15 DIAGNOSIS — F0281 Dementia in other diseases classified elsewhere with behavioral disturbance: Secondary | ICD-10-CM | POA: Diagnosis not present

## 2019-12-15 DIAGNOSIS — F419 Anxiety disorder, unspecified: Secondary | ICD-10-CM | POA: Diagnosis not present

## 2019-12-15 DIAGNOSIS — K5904 Chronic idiopathic constipation: Secondary | ICD-10-CM

## 2019-12-15 DIAGNOSIS — G301 Alzheimer's disease with late onset: Secondary | ICD-10-CM | POA: Diagnosis not present

## 2019-12-15 DIAGNOSIS — F02818 Dementia in other diseases classified elsewhere, unspecified severity, with other behavioral disturbance: Secondary | ICD-10-CM

## 2019-12-15 NOTE — Progress Notes (Signed)
Careteam: Patient Care Team: Lauree Chandler, NP as PCP - General (Geriatric Medicine)  PLACE OF SERVICE:  [place] Directive information Does Patient Have a Medical Advance Directive?: Yes, Type of Advance Directive: [place];Out of facility DNR (pink MOST or yellow form), Pre-existing out of facility DNR order (yellow form or pink MOST form): Yellow form placed in chart (order not valid for inpatient use);Pink MOST form placed in chart (order not valid for inpatient use), Does patient want to make changes to medical advance directive?: No - Patient declined  Allergies  Allergen Reactions  . Penicillins     Chief Complaint  Patient presents with  . Medical Management of Chronic Issues    6 month follow-up, here with daughter [name]   . Quality Metric Gaps    Discuss need for Dexa   . Immunizations    Discuss need for TD   . Advanced Directive    Requesting 2 DNR's      HPI: Patient is a 86 y.o. female for routine follow up.  Continues with day program.  She had increase in agitation and behaviors therefore Seroquel was added to the morning. She is now taking seroquel 50 mg twice daily, she is much calmer at this time. Daughter reports it has been much better with the increase in medication. Not "tearing things up" now.  No increase in lethergy no increase in falls  Gait has been stable.   Still requiring alprazolam in the evening.  No increase in pain, using tylenol if needed (once weekly)  Constipation- controlled on colace 100 mg daily  Has had a bad experience with the dentist- needs to follow up.  Review of Systems:  Review of Systems  Unable to perform ROS: Dementia    Past Medical History:  Diagnosis Date  . Bradycardia   . Dehydration   . Dementia ([place])   . Unresponsive episode    History reviewed. No pertinent surgical history. Social History:   reports that she has never smoked. She has never used smokeless  tobacco. She reports that she does not drink alcohol and does not use drugs.  History reviewed. No pertinent family history.  Medications: Patient's Medications  New Prescriptions   No medications on file  Previous Medications   ACETAMINOPHEN (TYLENOL) 500 MG TABLET    Take 500 mg by mouth as needed.   ALPRAZOLAM (XANAX) 0.25 MG TABLET    Take 1 tablet (0.25 mg total) by mouth daily as needed for anxiety.   MELATONIN 5 MG CHEW    Chew 1 tablet by mouth at bedtime.   QUETIAPINE (SEROQUEL) 50 MG TABLET    To use 25 mg-50 mg by mouth in the morning and 50 mg at bedtime   SERTRALINE (ZOLOFT) 100 MG TABLET    Take 1 tablet (100 mg total) by mouth daily.  Modified Medications   No medications on file  Discontinued Medications   No medications on file    Physical Exam:  Vitals:   12/15/19 1102  BP: 136/62  Pulse: 67  Temp: (!) 97.1 F (36.2 C)  TempSrc: Temporal  SpO2: 99%  Weight: 114 lb (51.7 kg)  Height: 5' (1.524 m)   Body mass index is 22.26 kg/m. Wt Readings from Last 3 Encounters:  12/15/19 114 lb (51.7 kg)  11/01/19 110 lb (49.9 kg)  05/24/19 130 lb (59 kg)    Physical Exam Constitutional:      General: She is not  in acute distress.    Appearance: She is well-developed. She is not diaphoretic.  HENT:     Head: Normocephalic and atraumatic.     Mouth/Throat:     Pharynx: No oropharyngeal exudate.  Eyes:     Conjunctiva/sclera: Conjunctivae normal.     Pupils: Pupils are equal, round, and reactive to light.  Cardiovascular:     Rate and Rhythm: Normal rate and regular rhythm.     Heart sounds: Normal heart sounds.  Pulmonary:     Effort: Pulmonary effort is normal.     Breath sounds: Normal breath sounds.  Abdominal:     General: Bowel sounds are normal.     Palpations: Abdomen is soft.  Musculoskeletal:        General: No tenderness.     Cervical back: Normal range of motion and neck supple.  Skin:    General: Skin is warm and dry.  Neurological:      Mental Status: She is alert. Mental status is at baseline. She is disoriented.     Labs reviewed: Basic Metabolic Panel: No results for input(s): NA, K, CL, CO2, GLUCOSE, BUN, CREATININE, CALCIUM, MG, PHOS, TSH in the last 8760 hours. Liver Function Tests: No results for input(s): AST, ALT, ALKPHOS, BILITOT, PROT, ALBUMIN in the last 8760 hours. No results for input(s): LIPASE, AMYLASE in the last 8760 hours. No results for input(s): AMMONIA in the last 8760 hours. CBC: No results for input(s): WBC, NEUTROABS, HGB, HCT, MCV, PLT in the last 8760 hours. Lipid Panel: No results for input(s): CHOL, HDL, LDLCALC, TRIG, CHOLHDL, LDLDIRECT in the last 8760 hours. TSH: No results for input(s): TSH in the last 8760 hours. A1C: No results found for: HGBA1C   Assessment/Plan 1. Late onset Alzheimer's disease with behavioral disturbance ([place]) -advance dementia, requires 24/7 care through daughter and goes to day program. Behaviors have improved with twice daily Seroquel. No adverse effects noted.   2. Anxiety -stable on zoloft 100 mg and xanax qhs.   3. Chronic idiopathic constipation Controlled on colace  4. Weight loss Improved today, suspect weight loss to continue with progression of dementia. Daughter working with her to make food that she will eat  5. Insomnia, unspecified type Controlled on melatonin.  Next appt: 6 months.  Carlos American. [place], [facility] [phone]

## 2019-12-30 ENCOUNTER — Encounter: Payer: Self-pay | Admitting: Nurse Practitioner

## 2020-01-02 DIAGNOSIS — G4700 Insomnia, unspecified: Secondary | ICD-10-CM

## 2020-01-02 DIAGNOSIS — F419 Anxiety disorder, unspecified: Secondary | ICD-10-CM

## 2020-01-03 NOTE — Telephone Encounter (Signed)
Non-opioid contract completed in January.   Last OV 12/15/19  Last refill 12/06/19

## 2020-01-05 ENCOUNTER — Other Ambulatory Visit: Payer: Self-pay | Admitting: Nurse Practitioner

## 2020-01-05 ENCOUNTER — Other Ambulatory Visit: Payer: Self-pay

## 2020-01-05 ENCOUNTER — Other Ambulatory Visit: Payer: Self-pay | Admitting: Family

## 2020-01-05 DIAGNOSIS — F419 Anxiety disorder, unspecified: Secondary | ICD-10-CM

## 2020-01-05 DIAGNOSIS — G4700 Insomnia, unspecified: Secondary | ICD-10-CM

## 2020-01-05 NOTE — Telephone Encounter (Signed)
Also see note dated 01/02/2020 that was accidentally marked erroneous with Bridget's note.

## 2020-01-05 NOTE — Telephone Encounter (Signed)
This encounter was created in error - please disregard.

## 2020-01-10 ENCOUNTER — Encounter: Payer: Self-pay | Admitting: Nurse Practitioner

## 2020-01-10 NOTE — Telephone Encounter (Signed)
Routed to Jessica Eubanks NP  

## 2020-01-11 ENCOUNTER — Other Ambulatory Visit: Payer: Self-pay | Admitting: Nurse Practitioner

## 2020-01-11 DIAGNOSIS — F419 Anxiety disorder, unspecified: Secondary | ICD-10-CM

## 2020-01-11 MED ORDER — SERTRALINE HCL 100 MG PO TABS: 200.0000 mg | ORAL_TABLET | Freq: Every day | ORAL | 3 refills | Status: DC

## 2020-01-18 ENCOUNTER — Other Ambulatory Visit: Payer: Self-pay

## 2020-01-18 ENCOUNTER — Other Ambulatory Visit: Payer: Medicare Other

## 2020-01-18 DIAGNOSIS — F419 Anxiety disorder, unspecified: Secondary | ICD-10-CM

## 2020-01-18 LAB — BASIC METABOLIC PANEL WITH GFR
BUN/Creatinine Ratio: 14 (calc) (ref 6–22)
BUN: 14 mg/dL (ref 7–25)
CO2: 29 mmol/L (ref 20–32)
Calcium: 9.7 mg/dL (ref 8.6–10.4)
Chloride: 97 mmol/L — ABNORMAL LOW (ref 98–110)
Creat: 1 mg/dL — ABNORMAL HIGH (ref 0.60–0.88)
GFR, Est African American: 57 mL/min/{1.73_m2} — ABNORMAL LOW (ref >=60)
GFR, Est Non African American: 49 mL/min/{1.73_m2} — ABNORMAL LOW (ref >=60)
Glucose, Bld: 77 mg/dL (ref 65–139)
Potassium: 5.6 mmol/L — ABNORMAL HIGH (ref 3.5–5.3)
Sodium: 133 mmol/L — ABNORMAL LOW (ref 135–146)

## 2020-01-20 ENCOUNTER — Encounter: Payer: Self-pay | Admitting: Nurse Practitioner

## 2020-01-21 ENCOUNTER — Other Ambulatory Visit: Payer: Self-pay

## 2020-01-21 DIAGNOSIS — R7989 Other specified abnormal findings of blood chemistry: Secondary | ICD-10-CM

## 2020-01-21 DIAGNOSIS — E875 Hyperkalemia: Secondary | ICD-10-CM

## 2020-01-24 ENCOUNTER — Encounter: Payer: Self-pay | Admitting: Nurse Practitioner

## 2020-01-24 ENCOUNTER — Other Ambulatory Visit: Payer: Self-pay | Admitting: Nurse Practitioner

## 2020-01-24 DIAGNOSIS — F02818 Dementia in other diseases classified elsewhere, unspecified severity, with other behavioral disturbance: Secondary | ICD-10-CM

## 2020-01-25 ENCOUNTER — Other Ambulatory Visit: Payer: Self-pay | Admitting: *Deleted

## 2020-01-25 DIAGNOSIS — F419 Anxiety disorder, unspecified: Secondary | ICD-10-CM

## 2020-01-25 DIAGNOSIS — G301 Alzheimer's disease with late onset: Secondary | ICD-10-CM

## 2020-01-25 MED ORDER — QUETIAPINE FUMARATE 50 MG PO TABS: ORAL_TABLET | ORAL | 1 refills | Status: DC

## 2020-01-25 MED ORDER — SERTRALINE HCL 100 MG PO TABS: 200.0000 mg | ORAL_TABLET | Freq: Every day | ORAL | 3 refills | Status: DC

## 2020-01-25 NOTE — Telephone Encounter (Signed)
Patient requested refill through myChart. Faxed.

## 2020-02-01 ENCOUNTER — Other Ambulatory Visit: Payer: Self-pay | Admitting: Nurse Practitioner

## 2020-02-01 ENCOUNTER — Other Ambulatory Visit: Payer: Medicare Other

## 2020-02-01 ENCOUNTER — Other Ambulatory Visit: Payer: Self-pay

## 2020-02-01 DIAGNOSIS — R7989 Other specified abnormal findings of blood chemistry: Secondary | ICD-10-CM

## 2020-02-01 DIAGNOSIS — F419 Anxiety disorder, unspecified: Secondary | ICD-10-CM

## 2020-02-01 DIAGNOSIS — E875 Hyperkalemia: Secondary | ICD-10-CM

## 2020-02-01 DIAGNOSIS — G4700 Insomnia, unspecified: Secondary | ICD-10-CM

## 2020-02-01 LAB — BASIC METABOLIC PANEL WITH GFR
BUN/Creatinine Ratio: 13 (calc) (ref 6–22)
BUN: 14 mg/dL (ref 7–25)
CO2: 30 mmol/L (ref 20–32)
Calcium: 10 mg/dL (ref 8.6–10.4)
Chloride: 97 mmol/L — ABNORMAL LOW (ref 98–110)
Creat: 1.08 mg/dL — ABNORMAL HIGH (ref 0.60–0.88)
GFR, Est African American: 52 mL/min/{1.73_m2} — ABNORMAL LOW (ref >=60)
GFR, Est Non African American: 45 mL/min/{1.73_m2} — ABNORMAL LOW (ref >=60)
Glucose, Bld: 103 mg/dL (ref 65–139)
Potassium: 4.3 mmol/L (ref 3.5–5.3)
Sodium: 136 mmol/L (ref 135–146)

## 2020-02-02 ENCOUNTER — Other Ambulatory Visit: Payer: Self-pay | Admitting: Nurse Practitioner

## 2020-02-02 DIAGNOSIS — F419 Anxiety disorder, unspecified: Secondary | ICD-10-CM

## 2020-02-02 MED ORDER — ALPRAZOLAM 0.25 MG PO TABS: 0.2500 mg | ORAL_TABLET | Freq: Every day | ORAL | 0 refills | Status: DC | PRN

## 2020-02-02 NOTE — Telephone Encounter (Signed)
Non-opioid contract done 05/2019  Last refill 01/05/20

## 2020-03-02 ENCOUNTER — Encounter: Payer: Self-pay | Admitting: Nurse Practitioner

## 2020-03-02 ENCOUNTER — Other Ambulatory Visit: Payer: Self-pay

## 2020-03-02 DIAGNOSIS — F419 Anxiety disorder, unspecified: Secondary | ICD-10-CM

## 2020-03-03 MED ORDER — ALPRAZOLAM 0.25 MG PO TABS: 0.2500 mg | ORAL_TABLET | Freq: Every day | ORAL | 0 refills | Status: DC | PRN

## 2020-03-03 NOTE — Telephone Encounter (Signed)
MyChart message sent back to family with Jessica's response.

## 2020-03-03 NOTE — Telephone Encounter (Signed)
05/2019 contract done  Last labs - 02/01/20 Last OV 12/15/19  Changed pharmacy to [facility] [address] [place] per request

## 2020-03-03 NOTE — Telephone Encounter (Signed)
Patient requested refill Epic LR: 02/02/2020 Contract on file Pended Rx and sent to [place] for approval.

## 2020-03-03 NOTE — Telephone Encounter (Signed)
Will refill but please let her daughter know I see that she had Triazolam 0.25 MG Tablet filled by an outside provider, this is also a benzodiazapine like alprazolam. She should not be taking these from outside providers (refer to contract on file). Also duplicate therapy has increase risk of side effects such as respiratory  depression and death.

## 2020-03-03 NOTE — Telephone Encounter (Signed)
I see that she had Triazolam 0.25 MG Tablet filled by an outside provider, this is also a benzodiazapine like alprazolam. She should not be taking these from outside providers (refer to contract on file). Also duplicate therapy has increase risk of side effects such as respiratory  depression and death.

## 2020-04-02 ENCOUNTER — Other Ambulatory Visit: Payer: Self-pay | Admitting: Nurse Practitioner

## 2020-04-02 DIAGNOSIS — F419 Anxiety disorder, unspecified: Secondary | ICD-10-CM

## 2020-04-03 ENCOUNTER — Other Ambulatory Visit: Payer: Self-pay

## 2020-04-03 MED ORDER — ALPRAZOLAM 0.25 MG PO TABS: 0.2500 mg | ORAL_TABLET | Freq: Every day | ORAL | 0 refills | Status: DC | PRN

## 2020-04-20 ENCOUNTER — Other Ambulatory Visit: Payer: Self-pay | Admitting: Nurse Practitioner

## 2020-04-20 DIAGNOSIS — F0281 Dementia in other diseases classified elsewhere with behavioral disturbance: Secondary | ICD-10-CM

## 2020-04-20 DIAGNOSIS — F02818 Dementia in other diseases classified elsewhere, unspecified severity, with other behavioral disturbance: Secondary | ICD-10-CM

## 2020-04-27 ENCOUNTER — Other Ambulatory Visit: Payer: Self-pay | Admitting: Nurse Practitioner

## 2020-04-27 ENCOUNTER — Emergency Department (HOSPITAL_COMMUNITY)
Admission: EM | Admit: 2020-04-27 | Discharge: 2020-04-27 | Disposition: A | Discharge status: Home / Self Care | Payer: Medicare Other | Attending: Emergency Medicine | Admitting: Emergency Medicine

## 2020-04-27 ENCOUNTER — Encounter: Payer: Self-pay | Admitting: Nurse Practitioner

## 2020-04-27 ENCOUNTER — Other Ambulatory Visit: Payer: Self-pay

## 2020-04-27 DIAGNOSIS — F0390 Unspecified dementia without behavioral disturbance: Secondary | ICD-10-CM | POA: Insufficient documentation

## 2020-04-27 DIAGNOSIS — F419 Anxiety disorder, unspecified: Secondary | ICD-10-CM

## 2020-04-27 DIAGNOSIS — R1110 Vomiting, unspecified: Secondary | ICD-10-CM | POA: Insufficient documentation

## 2020-04-27 DIAGNOSIS — R55 Syncope and collapse: Secondary | ICD-10-CM | POA: Insufficient documentation

## 2020-04-27 NOTE — ED Provider Notes (Signed)
Terri Green DEPT Provider Note   CSN: [id number] Arrival date & time: 04/27/20  1625     History Chief Complaint  Patient presents with  . Loss of Consciousness    Terri Green is a 86 y.o. female history of dementia, here presenting with unresponsive episode.  Patient was sitting on the commode and had a large bowel movement and then had an episode of vomiting.  She then had another bowel movement and became unresponsive for several minutes.  This was witnessed by the daughter.  She did not have any head injury.  She is back to baseline now.  She has a MOST form that says that she does not want IV fluids but daughter states that she does want to get some lab work.  Patient denies any chest pain.   The history is provided by the patient.       Past Medical History:  Diagnosis Date  . Bradycardia   . Dehydration   . Dementia ([place])   . Unresponsive episode     Patient Active Problem List   Diagnosis Date Noted  . Constipation 05/24/2019  . Fecal impaction ([place]) 05/24/2019    No past surgical history on file.   OB History   No obstetric history on file.     No family history on file.  Social History   Tobacco Use  . Smoking status: Never Smoker  . Smokeless tobacco: Never Used  Vaping Use  . Vaping Use: Never used  Substance Use Topics  . Alcohol use: Never  . Drug use: Never    Home Medications Prior to Admission medications   Medication Sig Start Date End Date Taking? Authorizing Provider  acetaminophen (TYLENOL) 500 MG tablet Take 500 mg by mouth as needed.    [provider]  ALPRAZolam Duanne Moron) 0.25 MG tablet Take 1 tablet (0.25 mg total) by mouth daily as needed for anxiety. 04/03/20   Lauree Chandler, NP  Melatonin 5 MG CHEW Chew 1 tablet by mouth at bedtime.    [provider]  QUEtiapine (SEROQUEL) 50 MG tablet TAKE 25MG -50MG ( ONE-HALF TO ONE TABLET) BY MOUTH EVERY MORNING AND 50MG (ONE TABLET)  EVERY NIGHT AT BEDTIME 04/24/20   Lauree Chandler, NP  sertraline (ZOLOFT) 100 MG tablet Take 2 tablets (200 mg total) by mouth daily. 01/25/20   Lauree Chandler, NP    Allergies    Penicillins  Review of Systems   Review of Systems  Cardiovascular: Positive for syncope.  Neurological: Positive for syncope.  All other systems reviewed and are negative.   Physical Exam Updated Vital Signs BP (!) 148/66 (BP Location: Left Arm)   Pulse 86   Temp 98.2 F (36.8 C) (Oral)   Resp 16   Ht 5' (1.524 m)   Wt 51.7 kg   SpO2 94%   BMI 22.26 kg/m   Physical Exam Vitals and nursing note reviewed.  Constitutional:      Comments: Demented  HENT:     Head: Normocephalic.     Mouth/Throat:     Mouth: Mucous membranes are moist.  Eyes:     Extraocular Movements: Extraocular movements intact.     Pupils: Pupils are equal, round, and reactive to light.  Cardiovascular:     Rate and Rhythm: Normal rate and regular rhythm.     Pulses: Normal pulses.     Heart sounds: Normal heart sounds.  Pulmonary:     Effort: Pulmonary effort is normal.  Breath sounds: Normal breath sounds.  Abdominal:     General: Abdomen is flat.     Palpations: Abdomen is soft.  Musculoskeletal:        General: Normal range of motion.     Cervical back: Normal range of motion and neck supple.     Comments: No obvious extremity trauma  Skin:    General: Skin is warm.     Capillary Refill: Capillary refill takes less than 2 seconds.  Neurological:     Comments: Demented and moving all extremities and slightly agitated  Psychiatric:     Comments: Slightly agitated     ED Results / Procedures / Treatments   Labs (all labs ordered are listed, but only abnormal results are displayed) Labs Reviewed  CBC WITH DIFFERENTIAL/PLATELET  COMPREHENSIVE METABOLIC PANEL    EKG EKG Interpretation  Date/Time:  Thursday April 27 2020 17:18:42 EST Ventricular Rate:  59 PR Interval:    QRS  Duration: 94 QT Interval:  441 QTC Calculation: 437 R Axis:   -28 Text Interpretation: Sinus rhythm Borderline left axis deviation Abnormal R-wave progression, early transition No previous ECGs available Confirmed by Wandra Arthurs [phone]) on 04/27/2020 5:24:22 PM   Radiology No results found.  Procedures Procedures (including critical care time)  Medications Ordered in ED Medications - No data to display  ED Course  I have reviewed the triage vital signs and the nursing notes.  Pertinent labs & imaging results that were available during my care of the patient were reviewed by me and considered in my medical decision making (see chart for details).    MDM Rules/Calculators/A&P                         Terri Green is a 86 y.o. female here presenting with near syncope.  Patient is back to baseline now.  Patient did not have any head injury.  Patient has history of dementia.  I discussed options including doing nothing and sending patient home and daughter wants to check some basic blood work.  No need for CT head since she is back to baseline and she did not have any head injury and was witnessed by the daughter.   5:42 PM Patient is back to baseline. Daughter wants to hold off on labs right now. Will dc home.    Final Clinical Impression(s) / ED Diagnoses Final diagnoses:  None    Rx / DC Orders ED Discharge Orders    None       Drenda Freeze, MD 04/27/20 [phone]

## 2020-04-27 NOTE — ED Triage Notes (Addendum)
Pt came from home via EMS. Pt passed out after large BM. Lives at home with daughter who is a caretaker and HCPOA. PMH of dementia. Alert to person. Sinus brady upon EKG. Vomiting apparent to EMS upon arrival, pt's clothes covered in vomit

## 2020-04-27 NOTE — Discharge Instructions (Signed)
You likely have vasovagal syncope from the bowel movement.  Observe her mental status closely   See your doctor for follow up   Return to ER if she has lethargy, vomiting, diarrhea, another episode of passing out.

## 2020-04-28 NOTE — Telephone Encounter (Signed)
Received refill request from pharmacy. Epic LR: 04/03/2020 Contract on [employer] [facility] and sent to [place] for approval.

## 2020-05-02 ENCOUNTER — Other Ambulatory Visit: Payer: Self-pay | Admitting: Nurse Practitioner

## 2020-05-02 DIAGNOSIS — F419 Anxiety disorder, unspecified: Secondary | ICD-10-CM

## 2020-05-02 MED ORDER — ALPRAZOLAM 0.25 MG PO TABS: 0.2500 mg | ORAL_TABLET | Freq: Every day | ORAL | 0 refills | Status: DC | PRN

## 2020-05-02 NOTE — Telephone Encounter (Signed)
Last refill 04/03/20 Last OV 7/21 Last labs 02/01/20 Contract 06/25/19

## 2020-05-03 ENCOUNTER — Encounter: Payer: Self-pay | Admitting: Nurse Practitioner

## 2020-05-18 ENCOUNTER — Other Ambulatory Visit: Payer: Self-pay | Admitting: Adult Health

## 2020-05-18 DIAGNOSIS — K5641 Fecal impaction: Secondary | ICD-10-CM

## 2020-06-01 ENCOUNTER — Other Ambulatory Visit: Payer: Self-pay | Admitting: Nurse Practitioner

## 2020-06-01 DIAGNOSIS — F419 Anxiety disorder, unspecified: Secondary | ICD-10-CM

## 2020-06-01 MED ORDER — ALPRAZOLAM 0.25 MG PO TABS: 0.2500 mg | ORAL_TABLET | Freq: Every day | ORAL | 0 refills | Status: DC | PRN

## 2020-06-01 NOTE — Telephone Encounter (Signed)
Last filled on 05/02/2020 in Epic  Treatment agreement on file from 06/25/2019  No pending appointment, message sent to administrative staff to contact patient for 6 month follow-up.

## 2020-06-07 ENCOUNTER — Encounter: Payer: Self-pay | Admitting: Nurse Practitioner

## 2020-06-09 ENCOUNTER — Other Ambulatory Visit: Payer: Self-pay

## 2020-06-09 ENCOUNTER — Encounter: Payer: Self-pay | Admitting: Nurse Practitioner

## 2020-06-09 ENCOUNTER — Telehealth (INDEPENDENT_AMBULATORY_CARE_PROVIDER_SITE_OTHER): Payer: Medicare Other | Admitting: Nurse Practitioner

## 2020-06-09 DIAGNOSIS — G301 Alzheimer's disease with late onset: Secondary | ICD-10-CM | POA: Diagnosis not present

## 2020-06-09 DIAGNOSIS — K5901 Slow transit constipation: Secondary | ICD-10-CM | POA: Diagnosis not present

## 2020-06-09 DIAGNOSIS — F419 Anxiety disorder, unspecified: Secondary | ICD-10-CM

## 2020-06-09 DIAGNOSIS — F02818 Dementia in other diseases classified elsewhere, unspecified severity, with other behavioral disturbance: Secondary | ICD-10-CM

## 2020-06-09 DIAGNOSIS — F0281 Dementia in other diseases classified elsewhere with behavioral disturbance: Secondary | ICD-10-CM | POA: Diagnosis not present

## 2020-06-09 MED ORDER — QUETIAPINE FUMARATE 50 MG PO TABS: 50.0000 mg | ORAL_TABLET | Freq: Every day | ORAL | 0 refills | Status: DC

## 2020-06-09 NOTE — Progress Notes (Signed)
Careteam: Patient Care Team: Lauree Chandler, NP as PCP - General (Geriatric Medicine)  Advanced Directive information    Allergies  Allergen Reactions  . Penicillins     Chief Complaint  Patient presents with  . Acute Visit    Patient needs FL2 form completed for [email or web address]     HPI: Patient is a 87 y.o. female to complete FL2 for [facility].  She plans to go there for respite for 2 month while the daughter takes care of some health issues.  Things are going well at home.  She crushes her pills   Review of Systems:  Review of Systems  Unable to perform ROS: Dementia    Past Medical History:  Diagnosis Date  . Bradycardia   . Dehydration   . Dementia ([place])   . Unresponsive episode    No past surgical history on file. Social History:   reports that she has never smoked. She has never used smokeless tobacco. She reports that she does not drink alcohol and does not use drugs.  No family history on file.   Medications: Patient's Medications  New Prescriptions   No medications on file  Previous Medications   ACETAMINOPHEN (TYLENOL) 500 MG TABLET    Take 500 mg by mouth as needed.   ALPRAZOLAM (XANAX) 0.25 MG TABLET    Take 1 tablet (0.25 mg total) by mouth daily as needed for anxiety.   DOCUSATE SODIUM (STOOL SOFTENER) 100 MG CAPSULE    Take 100 mg by mouth daily.   MELATONIN 5 MG CHEW    Chew 1 tablet by mouth at bedtime.   QUETIAPINE (SEROQUEL) 50 MG TABLET    TAKE 25MG -50MG ( ONE-HALF TO ONE TABLET) BY MOUTH EVERY MORNING AND 50MG (ONE TABLET) EVERY NIGHT AT BEDTIME   SERTRALINE (ZOLOFT) 100 MG TABLET    Take 2 tablets (200 mg total) by mouth daily.  Modified Medications   No medications on file  Discontinued Medications   No medications on file    Physical Exam:  There were no vitals filed for this visit. There is no height or weight on file to calculate BMI. Wt Readings from Last 3 Encounters:  04/27/20 113 lb 15.7 oz (51.7 kg)  12/15/19 114 lb  (51.7 kg)  11/01/19 110 lb (49.9 kg)      Labs reviewed: Basic Metabolic Panel: Recent Labs    01/18/20 1428 02/01/20 1431  NA 133* 136  K 5.6* 4.3  CL 97* 97*  CO2 29 30  GLUCOSE 77 103  BUN 14 14  CREATININE 1.00* 1.08*  CALCIUM 9.7 10.0   Liver Function Tests: No results for input(s): AST, ALT, ALKPHOS, BILITOT, PROT, ALBUMIN in the last 8760 hours. No results for input(s): LIPASE, AMYLASE in the last 8760 hours. No results for input(s): AMMONIA in the last 8760 hours. CBC: No results for input(s): WBC, NEUTROABS, HGB, HCT, MCV, PLT in the last 8760 hours. Lipid Panel: No results for input(s): CHOL, HDL, LDLCALC, TRIG, CHOLHDL, LDLDIRECT in the last 8760 hours. TSH: No results for input(s): TSH in the last 8760 hours. A1C: No results found for: HGBA1C   Assessment/Plan 1. Late onset Alzheimer's disease with behavioral disturbance (HCC) -stable, without acute changes in cognitive or functional status.  -she needs assistance with most ADLs and has a caregiver at home -plans to go to memory care unit for respite while daughter takes care of some health issues. -only giving Seroquel daily which has been working well.  - QUEtiapine (SEROQUEL)  50 MG tablet; Take 1 tablet (50 mg total) by mouth at bedtime.  Dispense: 90 tablet; Refill: 0 -FL2 completed.   2. Anxiety Well controlled on zoloft 200 mg daily   3. Slow transit constipation -stable on prune juice daily with docusate, but she is crushing this medication and ideally should be swallowed whole. She plans to get liquid form for her.   Next appt: 06/16/2020 Carlos American. Harle Battiest  [facility] [phone]    Virtual Visit via my chart I connected with patient on 06/09/20 at  3:15 PM EST by video and verified that I am speaking with the correct person using two identifiers.  Location: Patient: home Provider: [facility]   I discussed the limitations, risks, security and privacy  concerns of performing an evaluation and management service by telephone and the availability of in person appointments. I also discussed with the patient that there may be a patient responsible charge related to this service. The patient expressed understanding and agreed to proceed.   I discussed the assessment and treatment plan with the patient. The patient was provided an opportunity to ask questions and all were answered. The patient agreed with the plan and demonstrated an understanding of the instructions.   The patient was advised to call back or seek an in-person evaluation if the symptoms worsen or if the condition fails to improve as anticipated.  I provided 20 minutes of non-face-to-face time during this encounter.  Carlos American. Harle Battiest Avs printed and mailed

## 2020-06-13 ENCOUNTER — Encounter: Payer: Self-pay | Admitting: Nurse Practitioner

## 2020-06-13 DIAGNOSIS — F419 Anxiety disorder, unspecified: Secondary | ICD-10-CM

## 2020-06-14 ENCOUNTER — Telehealth: Payer: Self-pay | Admitting: *Deleted

## 2020-06-14 ENCOUNTER — Other Ambulatory Visit: Payer: Self-pay | Admitting: Nurse Practitioner

## 2020-06-14 DIAGNOSIS — G301 Alzheimer's disease with late onset: Secondary | ICD-10-CM

## 2020-06-14 DIAGNOSIS — F0281 Dementia in other diseases classified elsewhere with behavioral disturbance: Secondary | ICD-10-CM

## 2020-06-14 NOTE — Telephone Encounter (Signed)
I faxed form over after patient's appointment.It was faxed to [phone].

## 2020-06-14 NOTE — Telephone Encounter (Signed)
Refaxed forms to [employer] Fax: [phone]

## 2020-06-14 NOTE — Telephone Encounter (Signed)
Looks like patient had an appointment with Jessica on 06/09/2020 to complete FL2. I will forward to medical assistant that was working with Lauree Chandler, NP on 06/09/2020 to give input on the status of form

## 2020-06-14 NOTE — Telephone Encounter (Signed)
Terri Green with [facility] called and stated that patient is coming to facility on Monday for Respite Stay and they are needing the FL2 Form. Stated that she has not received it. Wants it faxed to her at Fax: [phone]  Please Advise.

## 2020-06-15 ENCOUNTER — Encounter: Payer: Self-pay | Admitting: Nurse Practitioner

## 2020-06-15 NOTE — Telephone Encounter (Signed)
That is not on her active problem list. If you would like to reconcile all the outside diagnosis (add/delete) and update her problem list, I will gladly refax an updated problem list

## 2020-06-15 NOTE — Telephone Encounter (Signed)
She has a diagnosis of dementia with behaviors

## 2020-06-15 NOTE — Telephone Encounter (Signed)
Done. Thank you.

## 2020-06-15 NOTE — Telephone Encounter (Signed)
Incoming call received from [place] with [facility] stating FL2 was received and the diagnosis was left blank. Rich Reining states if patient does not have any diagnosis then N/A should be noted.  Rich Reining asked that I fax a copy of patients problem list.  Problem list was faxed as requested to [phone]

## 2020-06-15 NOTE — Telephone Encounter (Signed)
Updated problem list faxed.

## 2020-06-16 ENCOUNTER — Ambulatory Visit: Payer: Medicare Other | Admitting: Nurse Practitioner

## 2020-06-19 ENCOUNTER — Telehealth: Payer: Self-pay | Admitting: *Deleted

## 2020-06-19 MED ORDER — ACETAMINOPHEN 325 MG PO TABS: 325.0000 mg | ORAL_TABLET | Freq: Four times a day (QID) | ORAL | 0 refills | Status: AC | PRN

## 2020-06-19 MED ORDER — SERTRALINE HCL 100 MG PO TABS: 200.0000 mg | ORAL_TABLET | Freq: Every day | ORAL | 3 refills | Status: DC

## 2020-06-19 NOTE — Telephone Encounter (Signed)
Patient is staying at [facility] for [place] and daughter brought in Tylenol 325mg  for patient but on her FL2 Form it has Tylenol 500mg .  They are needing a verbal ok for the Tylenol 325mg  and directions of how you want them to give to patient.   Please Advise.

## 2020-06-19 NOTE — Telephone Encounter (Signed)
Isabelle Course with [facility] notified and agreed.

## 2020-06-19 NOTE — Telephone Encounter (Signed)
Yes okay to give tylenol 325 mg 2 tablets every 6 hours as needed for pain

## 2020-06-27 ENCOUNTER — Encounter: Payer: Self-pay | Admitting: Nurse Practitioner

## 2020-06-27 NOTE — Telephone Encounter (Signed)
Message routed to [place], Jessica K, NP  

## 2020-07-07 ENCOUNTER — Non-Acute Institutional Stay: Payer: Medicare Other | Admitting: Student

## 2020-07-07 ENCOUNTER — Other Ambulatory Visit: Payer: Self-pay

## 2020-07-07 DIAGNOSIS — Z515 Encounter for palliative care: Secondary | ICD-10-CM

## 2020-07-07 NOTE — Progress Notes (Signed)
[facility] Palliative Care Consult Note Telephone: [phone]  Fax: [phone]  PATIENT NAME: Terri Green [address] [place] [address] [phone] (home)  DOB: 05/02/1927 MRN: [id number]  PRIMARY CARE PROVIDER:    Lauree Chandler, NP,  [place] [address] [phone]  REFERRING PROVIDER:   Lauree Chandler, NP [place],  [place] [address] [phone]  RESPONSIBLE PARTY:   Extended Emergency Contact Information Primary Emergency Contact: Mykenzi, Vanzile Home Phone: [phone] Relation: Daughter  I met face to face with patient in Assisted Living facility.    ASSESSMENT AND RECOMMENDATIONS:   Advance Care Planning: Visit at the request of Sherrie Mustache, NP for palliative consult. Visit consisted of building trust and discussions on Palliative Medicine as specialized medical care for people living with serious illness, aimed at facilitating better quality of life through symptoms relief, assisting with advance care plan and establishing goals of care. Palliative care will continue to provide support to patient, family and the medical team.  Goal of care: Continue respite stay until able to return home with daughter, Terri Green.   Directives: Has DNR, MOST form on file.    Symptom Management:   Dementia-FAST score 6E. Patient currently residing at [facility] as daughter is currently dealing with medical issues. Patient requires frequent cueing/redirection. She has had several falls. Staff to continue to assist with all adl's, encourage use of walker with ambulation, monitor for safety/falls. Continue sertraline, quetiapine and alprazolam as directed.   Constipation-nursing staff report constipation. Recommendation: Change miralax to 17gm PO daily routinely, increase colace to 222m PO daily, adequate fluids are encouraged.   Follow up Palliative Care Visit: Palliative care  will continue to follow for goals of care clarification and symptom management. Return 4 weeks or prn.  Family /Caregiver/Community Supports: No outside agencies present. Palliative Medicine will continue to provide support.    I spent 25 minutes providing this consultation, time includes time spent with patient/family, chart review, provider coordination, and documentation. More than 50% of the time in this consultation was spent counseling and coordinating communication.   CHIEF COMPLAINT: Palliative Medicine visit follow up  History obtained from review of EMR, discussion with primary team, and interview with family and staff. Findings are summarized below.  HISTORY OF PRESENT ILLNESS:  FCanaan Prueis a 87y.o. year old female with multiple medical problems including dementia, anxiety, depression, constipation. Palliative Care was asked to follow this patient by consultation request of ELauree Chandler NP to help address advance care planning and goals of care. This is a follow up visit. Ms. SGodsilrequires assistance with all adl's. She requires frequent cueing and assistance. She uses walker for ambulation. She has had several falls recently. Two falls on 2/5, 2/1 and 1/31 per staff; no injuries reported. Ms. SVanalstinedenies pain. Staff report patient still having some constipation after Miralax prn was added. Fair diet per staff; she requires much cueing to eat meals.Receiving a pureed diet.  Current weight 119 pounds. Staff endorses resident sleeping well. No recent infections or  Hospitalizations reported.   CODE STATUS: DNR  PPS: 40%  HOSPICE ELIGIBILITY/DIAGNOSIS: TBD  PHYSICAL EXAM / ROS:   VS: pulse 66, respirations 16, b/p 130/80 Current weight: 119 pounds General: NAD, frail appearing, thin Cardiovascular: RRR, no edema, no chest pain reported Pulmonary: LCTA, no cough, no increased SOB, room air GI: Abdomen soft, non-tender, BS normoactive x 4 GU:  incontinent  of urine  MSK: ambulatory with walker Skin: no rashes or wounds reported Neurological: alert and oriented to person. Weakness, but otherwise nonfocal Psych: pleasant, non-anxious affect  PAST MEDICAL HISTORY:  Past Medical History:  Diagnosis Date  . Bradycardia   . Dehydration   . Dementia ([place])   . Unresponsive episode     SOCIAL HX:  Social History   Tobacco Use  . Smoking status: Never Smoker  . Smokeless tobacco: Never Used  Substance Use Topics  . Alcohol use: Never   FAMILY HX: No family history on file.  ALLERGIES:  Allergies  Allergen Reactions  . Penicillins      PERTINENT MEDICATIONS:  Outpatient Encounter Medications as of 07/07/2020  Medication Sig  . acetaminophen (TYLENOL) 325 MG tablet Take 1 tablet (325 mg total) by mouth every 6 (six) hours as needed.  . ALPRAZolam (XANAX) 0.25 MG tablet Take 1 tablet (0.25 mg total) by mouth daily as needed for anxiety.  Mariane Baumgarten Sodium (STOOL SOFTENER) 100 MG capsule Take 100 mg by mouth daily.  . Melatonin 5 MG CHEW Chew 1 tablet by mouth at bedtime.  Marland Kitchen QUEtiapine (SEROQUEL) 50 MG tablet Take 1 tablet (50 mg total) by mouth at bedtime.  . sertraline (ZOLOFT) 100 MG tablet Take 2 tablets (200 mg total) by mouth daily.   No facility-administered encounter medications on file as of 07/07/2020.     Thank you for the opportunity to participate in the care of Ms. Calixte. The palliative care team will continue to follow. Please call our office at [phone] if we can be of additional assistance.  Ezekiel Slocumb, NP , AGPCNP-C

## 2020-07-29 ENCOUNTER — Other Ambulatory Visit: Payer: Self-pay | Admitting: Nurse Practitioner

## 2020-07-29 DIAGNOSIS — F419 Anxiety disorder, unspecified: Secondary | ICD-10-CM

## 2020-07-31 MED ORDER — ALPRAZOLAM 0.25 MG PO TABS: 0.2500 mg | ORAL_TABLET | Freq: Every day | ORAL | 0 refills | Status: DC | PRN

## 2020-07-31 NOTE — Telephone Encounter (Signed)
Patient has requested refill on medication "Alprazolam 0.25mg ". Last refill was 06/01/2020 with 30 tablets to be taken every 6 hours as needed. Medication sent to PCP [name] Carlos American, NP for approval.

## 2020-09-21 ENCOUNTER — Telehealth: Payer: Self-pay

## 2020-09-21 NOTE — Telephone Encounter (Signed)
Called and clarified . Thank you

## 2020-09-21 NOTE — Telephone Encounter (Signed)
Juliann Pulse with the [employer] called stating they are auditing some records at [facility] and she would like to speak with you to verify some orders on the patient.   Juliann Pulse gave a DOB of [birth year] but Epic has it as Jun 06, 1926.  To Joelene Millin

## 2020-10-05 ENCOUNTER — Encounter: Payer: Self-pay | Admitting: Nurse Practitioner

## 2020-10-11 ENCOUNTER — Encounter: Payer: Self-pay | Admitting: Nurse Practitioner

## 2020-10-11 NOTE — Telephone Encounter (Signed)
Message sent to Eubanks, Jessica K, NP  

## 2020-10-12 NOTE — Telephone Encounter (Signed)
Message routed to PCP Eubanks, Jessica K, NP  

## 2020-11-09 ENCOUNTER — Encounter: Payer: Self-pay | Admitting: Nurse Practitioner

## 2020-11-09 ENCOUNTER — Other Ambulatory Visit: Payer: Self-pay | Admitting: Nurse Practitioner

## 2020-11-09 DIAGNOSIS — F419 Anxiety disorder, unspecified: Secondary | ICD-10-CM

## 2020-11-09 MED ORDER — ALPRAZOLAM 0.25 MG PO TABS: 0.2500 mg | ORAL_TABLET | Freq: Every day | ORAL | 1 refills | Status: DC | PRN

## 2020-11-10 ENCOUNTER — Encounter: Payer: Self-pay | Admitting: Nurse Practitioner

## 2020-11-20 ENCOUNTER — Other Ambulatory Visit: Payer: Self-pay

## 2020-11-20 ENCOUNTER — Encounter: Payer: Self-pay | Admitting: Nurse Practitioner

## 2020-11-20 ENCOUNTER — Emergency Department (HOSPITAL_BASED_OUTPATIENT_CLINIC_OR_DEPARTMENT_OTHER)
Admission: EM | Admit: 2020-11-20 | Discharge: 2020-11-20 | Disposition: A | Discharge status: Home / Self Care | Payer: Medicare Other | Attending: Emergency Medicine | Admitting: Emergency Medicine

## 2020-11-20 ENCOUNTER — Emergency Department (HOSPITAL_BASED_OUTPATIENT_CLINIC_OR_DEPARTMENT_OTHER): Payer: Medicare Other

## 2020-11-20 ENCOUNTER — Encounter (HOSPITAL_BASED_OUTPATIENT_CLINIC_OR_DEPARTMENT_OTHER): Payer: Self-pay

## 2020-11-20 DIAGNOSIS — Z043 Encounter for examination and observation following other accident: Secondary | ICD-10-CM | POA: Insufficient documentation

## 2020-11-20 DIAGNOSIS — W19XXXA Unspecified fall, initial encounter: Secondary | ICD-10-CM | POA: Insufficient documentation

## 2020-11-20 DIAGNOSIS — F0390 Unspecified dementia without behavioral disturbance: Secondary | ICD-10-CM | POA: Insufficient documentation

## 2020-11-20 DIAGNOSIS — I10 Essential (primary) hypertension: Secondary | ICD-10-CM | POA: Insufficient documentation

## 2020-11-20 DIAGNOSIS — Y92129 Unspecified place in nursing home as the place of occurrence of the external cause: Secondary | ICD-10-CM | POA: Insufficient documentation

## 2020-11-20 NOTE — ED Triage Notes (Addendum)
Pt arrives EMS from [facility] off of [address] after a unwitnessed fall/ rolled out of bed. Denies any pain. No anticoagulants on MAR. No abrasions present. Hx dementia and at baseline per EMS/ facility. DNR with pt. Cervical collar present on arrival

## 2020-11-20 NOTE — ED Notes (Signed)
Pt undressed and placed on external female catheter. Pt tearing off all cardiac leads and pulse oximeter.

## 2020-11-20 NOTE — ED Notes (Signed)
Brief checked and remains clean and dry. [place] removed for transportation back to nursing facility. VS updated. Attempt #5 to facility attempted with no answer.

## 2020-11-20 NOTE — ED Provider Notes (Signed)
[facility] EMERGENCY DEPARTMENT Provider Note   CSN: [id number] Arrival date & time: 11/20/20  0403     History Chief Complaint  Patient presents with   Terri Green    Terri Green is a 87 y.o. female.  The history is provided by the EMS personnel. The history is limited by the condition of the patient (dementia).  Fall This is a new problem. The current episode started less than 1 hour ago. The problem occurs constantly. The problem has not changed since onset.Nothing aggravates the symptoms. Nothing relieves the symptoms. She has tried nothing for the symptoms. The treatment provided no relief.  Unwitnessed fall at [facility]. No complaints     Past Medical History:  Diagnosis Date   Bradycardia    Dehydration    Dementia ([place])    Unresponsive episode     Patient Active Problem List   Diagnosis Date Noted   Constipation 05/24/2019   Anemia 05/06/2018   Vitamin D deficiency 07/18/2016   Anxiety 06/21/2015   Allergic rhinitis due to pollen 10/19/2014   Dementia ([place]) with behaviors  09/07/2014   Poor appetite 09/07/2014   Pure hypercholesterolemia, unspecified 06/01/2014   Benign essential hypertension 06/01/2014    History reviewed. No pertinent surgical history.   OB History   No obstetric history on file.     History reviewed. No pertinent family history.  Social History   Tobacco Use   Smoking status: Never   Smokeless tobacco: Never  Vaping Use   Vaping Use: Never used  Substance Use Topics   Alcohol use: Never   Drug use: Never    Home Medications Prior to Admission medications   Medication Sig Start Date End Date Taking? Authorizing Provider  acetaminophen (TYLENOL) 325 MG tablet Take 1 tablet (325 mg total) by mouth every 6 (six) hours as needed. 06/19/20   Lauree Chandler, NP  ALPRAZolam Duanne Moron) 0.25 MG tablet Take 1 tablet (0.25 mg total) by mouth daily as needed for anxiety. 11/09/20   Lauree Chandler, NP  Docusate Sodium  (STOOL SOFTENER) 100 MG capsule Take 100 mg by mouth daily.    [provider]  Melatonin 5 MG CHEW Chew 1 tablet by mouth at bedtime.    [provider]  QUEtiapine (SEROQUEL) 50 MG tablet Take 1 tablet (50 mg total) by mouth at bedtime. 06/09/20   Lauree Chandler, NP  sertraline (ZOLOFT) 100 MG tablet Take 2 tablets (200 mg total) by mouth daily. 06/19/20   Lauree Chandler, NP    Allergies    Penicillins  Review of Systems   Review of Systems  Unable to perform ROS: Dementia  Constitutional:  Negative for fever.  HENT:  Negative for drooling.   Eyes:  Negative for redness.  Respiratory:  Negative for wheezing.   Skin:  Negative for wound.  Neurological:  Negative for facial asymmetry.   Physical Exam Updated Vital Signs BP (!) 156/76   Pulse (!) 59   Temp 97.8 F (36.6 C) (Oral)   Resp 18   SpO2 100%   Physical Exam Vitals and nursing note reviewed.  Constitutional:      General: She is not in acute distress.    Appearance: Normal appearance.  HENT:     Head: Normocephalic and atraumatic.     Right Ear: Tympanic membrane normal.     Left Ear: Tympanic membrane normal.     Nose: Nose normal.  Eyes:     Conjunctiva/sclera: Conjunctivae normal.  Pupils: Pupils are equal, round, and reactive to light.  Cardiovascular:     Rate and Rhythm: Normal rate and regular rhythm.     Pulses: Normal pulses.     Heart sounds: Normal heart sounds.  Pulmonary:     Effort: Pulmonary effort is normal.     Breath sounds: Normal breath sounds.  Abdominal:     General: Abdomen is flat. Bowel sounds are normal.     Palpations: Abdomen is soft.     Tenderness: There is no abdominal tenderness. There is no guarding.  Musculoskeletal:        General: Normal range of motion.     Right wrist: Normal. No snuff box tenderness.     Left wrist: Normal. No snuff box tenderness.     Right hand: Normal.     Left hand: Normal.     Cervical back: Normal, normal range of  motion and neck supple.     Thoracic back: Normal.     Lumbar back: Normal.     Right hip: Normal.     Left hip: Normal.     Right ankle: Normal.     Right Achilles Tendon: Normal.     Left ankle: Normal.     Left Achilles Tendon: Normal.     Right foot: Normal.     Left foot: Normal.  Skin:    General: Skin is warm and dry.     Capillary Refill: Capillary refill takes less than 2 seconds.  Neurological:     General: No focal deficit present.     Mental Status: She is alert.     Deep Tendon Reflexes: Reflexes normal.  Psychiatric:        Mood and Affect: Mood normal.    ED Results / Procedures / Treatments   Labs (all labs ordered are listed, but only abnormal results are displayed) Labs Reviewed - No data to display  EKG None  Radiology No results found.  Procedures Procedures   Medications Ordered in ED Medications - No data to display  ED Course  I have reviewed the triage vital signs and the nursing notes.  Pertinent labs & imaging results that were available during my care of the patient were reviewed by me and considered in my medical decision making (see chart for details).   No acute injuries post fall    Raynelle Fujikawa was evaluated in Emergency Department on 11/20/2020 for the symptoms described in the history of present illness. She was evaluated in the context of the global COVID-19 pandemic, which necessitated consideration that the patient might be at risk for infection with the SARS-CoV-2 virus that causes COVID-19. Institutional protocols and algorithms that pertain to the evaluation of patients at risk for COVID-19 are in a state of rapid change based on information released by regulatory bodies including the CDC and federal and state organizations. These policies and algorithms were followed during the patient's care in the ED.  Final Clinical Impression(s) / ED Diagnoses Final diagnoses:  None    Return for intractable cough, coughing up  blood, fevers > 100.4 unrelieved by medication, shortness of breath, intractable vomiting, chest pain, shortness of breath, weakness, numbness, changes in speech, facial asymmetry, abdominal pain, passing out, Inability to tolerate liquids or food, cough, altered mental status or any concerns. No signs of systemic illness or infection. The patient is nontoxic-appearing on exam and vital signs are within normal limits. I have reviewed the triage vital signs and the nursing notes. Pertinent  labs & imaging results that were available during my care of the patient were reviewed by me and considered in my medical decision making (see chart for details). After history, exam, and medical workup I feel the patient has been appropriately medically screened and is safe for discharge home. Pertinent diagnoses were discussed with the patient. Patient was given return precautions.  Rx / DC Orders ED Discharge Orders     None        Josyah Achor, MD 11/20/20 1696

## 2020-11-20 NOTE — ED Notes (Signed)
Multiple attempts to call report to [facility] care. No answer.   [phone] and [phone]

## 2020-11-20 NOTE — ED Notes (Signed)
Yellow nonslip sock applied. Pt transported to and from [facility] with RN. Fall and DNR wrist band applied.

## 2020-12-07 ENCOUNTER — Encounter: Payer: Self-pay | Admitting: Orthopedic Surgery

## 2020-12-07 ENCOUNTER — Ambulatory Visit (INDEPENDENT_AMBULATORY_CARE_PROVIDER_SITE_OTHER): Payer: Medicare Other | Admitting: Orthopedic Surgery

## 2020-12-07 ENCOUNTER — Other Ambulatory Visit: Payer: Self-pay

## 2020-12-07 VITALS — BP 132/88 | HR 62 | Ht 64.5 in | Wt 118.4 lb

## 2020-12-07 DIAGNOSIS — D171 Benign lipomatous neoplasm of skin and subcutaneous tissue of trunk: Secondary | ICD-10-CM

## 2020-12-07 NOTE — Progress Notes (Signed)
Careteam: Patient Care Team: Lauree Chandler, NP as PCP - General (Geriatric Medicine)  Seen by: Windell Moulding, AGNP-C  PLACE OF SERVICE:  [place] Directive information    Allergies  Allergen Reactions   Penicillins     No chief complaint on file.    HPI: Patient is a 87 y.o. female seen today for acute visit due to cyst on back.   Daughters present during encounter today.   She currently resides at [facility] due to advanced dementia. Nursing staff noticed a lump on her upper left back and notified family 07/11. She cannot answer questions during examination due to dementia. She does not appear to be in pain.   Daughters deny any recent falls, injuries or behavioral outbursts.   Review of Systems:  Review of Systems  Unable to perform ROS: Dementia   Past Medical History:  Diagnosis Date   Bradycardia    Dehydration    Dementia ([place])    Unresponsive episode    No past surgical history on file. Social History:   reports that she has never smoked. She has never used smokeless tobacco. She reports that she does not drink alcohol and does not use drugs.  No family history on file.  Medications: Patient's Medications  New Prescriptions   No medications on file  Previous Medications   ACETAMINOPHEN (TYLENOL) 325 MG TABLET    Take 1 tablet (325 mg total) by mouth every 6 (six) hours as needed.   ALPRAZOLAM (XANAX) 0.25 MG TABLET    Take 1 tablet (0.25 mg total) by mouth daily as needed for anxiety.   DOCUSATE SODIUM 100 MG CAPSULE    Take 100 mg by mouth daily.   MELATONIN 5 MG CHEW    Chew 1 tablet by mouth at bedtime.   QUETIAPINE (SEROQUEL) 50 MG TABLET    Take 1 tablet (50 mg total) by mouth at bedtime.   SERTRALINE (ZOLOFT) 100 MG TABLET    Take 2 tablets (200 mg total) by mouth daily.  Modified Medications   No medications on file  Discontinued Medications   No medications on file    Physical Exam:  Vitals:   12/07/20 1448  BP: 132/88   Pulse: 62  SpO2: 95%  Weight: 118 lb 6.4 oz (53.7 kg)  Height: 5' 4.5" (1.638 m)   Body mass index is 20.01 kg/m. Wt Readings from Last 3 Encounters:  12/07/20 118 lb 6.4 oz (53.7 kg)  04/27/20 113 lb 15.7 oz (51.7 kg)  12/15/19 114 lb (51.7 kg)    Physical Exam Vitals reviewed.  Constitutional:      General: She is not in acute distress. Cardiovascular:     Rate and Rhythm: Normal rate and regular rhythm.     Pulses: Normal pulses.     Heart sounds: Normal heart sounds. No murmur heard. Pulmonary:     Effort: Pulmonary effort is normal. No respiratory distress.     Breath sounds: Normal breath sounds. No wheezing.  Skin:    General: Skin is warm and dry.     Capillary Refill: Capillary refill takes less than 2 seconds.       Neurological:     General: No focal deficit present.     Mental Status: She is alert. Mental status is at baseline.     Motor: Weakness present.     Gait: Gait abnormal.     Comments: walker  Psychiatric:        Mood  and Affect: Mood normal.        Behavior: Behavior normal.        Cognition and Memory: Memory is impaired.    Labs reviewed: Basic Metabolic Panel: Recent Labs    01/18/20 1428 02/01/20 1431  NA 133* 136  K 5.6* 4.3  CL 97* 97*  CO2 29 30  GLUCOSE 77 103  BUN 14 14  CREATININE 1.00* 1.08*  CALCIUM 9.7 10.0   Liver Function Tests: No results for input(s): AST, ALT, ALKPHOS, BILITOT, PROT, ALBUMIN in the last 8760 hours. No results for input(s): LIPASE, AMYLASE in the last 8760 hours. No results for input(s): AMMONIA in the last 8760 hours. CBC: No results for input(s): WBC, NEUTROABS, HGB, HCT, MCV, PLT in the last 8760 hours. Lipid Panel: No results for input(s): CHOL, HDL, LDLCALC, TRIG, CHOLHDL, LDLDIRECT in the last 8760 hours. TSH: No results for input(s): TSH in the last 8760 hours. A1C: No results found for: HGBA1C   Assessment/Plan 1. Lipoma of back - soft, movable, and painless area to left shoulder  blade - no apparent injury or sign of infection - follow up with PCP is area becomes larger or painful   Total time: 15 minutes. Greater than 50% of total time spent doing patient education.   Next appt: none Terri Green [place], [facility] [phone]

## 2020-12-07 NOTE — Patient Instructions (Signed)
Please report if Lipoma increases in size or becomes painful

## 2020-12-13 ENCOUNTER — Other Ambulatory Visit: Payer: Self-pay | Admitting: Family

## 2020-12-13 ENCOUNTER — Encounter: Payer: Self-pay | Admitting: Nurse Practitioner

## 2020-12-13 DIAGNOSIS — F419 Anxiety disorder, unspecified: Secondary | ICD-10-CM

## 2020-12-13 MED ORDER — ALPRAZOLAM 0.25 MG PO TABS: 0.2500 mg | ORAL_TABLET | Freq: Every day | ORAL | 1 refills | Status: AC | PRN

## 2021-02-06 ENCOUNTER — Other Ambulatory Visit: Payer: Self-pay | Admitting: Nurse Practitioner

## 2021-02-06 DIAGNOSIS — F0281 Dementia in other diseases classified elsewhere with behavioral disturbance: Secondary | ICD-10-CM

## 2021-02-06 DIAGNOSIS — F02818 Dementia in other diseases classified elsewhere, unspecified severity, with other behavioral disturbance: Secondary | ICD-10-CM

## 2021-02-06 MED ORDER — QUETIAPINE FUMARATE 50 MG PO TABS: 50.0000 mg | ORAL_TABLET | Freq: Every day | ORAL | 0 refills | Status: DC

## 2021-03-08 ENCOUNTER — Other Ambulatory Visit: Payer: Self-pay | Admitting: Nurse Practitioner

## 2021-03-08 DIAGNOSIS — F419 Anxiety disorder, unspecified: Secondary | ICD-10-CM

## 2021-04-09 ENCOUNTER — Other Ambulatory Visit: Payer: Self-pay | Admitting: Nurse Practitioner

## 2021-04-09 DIAGNOSIS — F419 Anxiety disorder, unspecified: Secondary | ICD-10-CM

## 2021-04-19 ENCOUNTER — Encounter: Payer: Self-pay | Admitting: Nurse Practitioner

## 2021-04-19 ENCOUNTER — Other Ambulatory Visit: Payer: Self-pay | Admitting: Nurse Practitioner

## 2021-04-19 DIAGNOSIS — F419 Anxiety disorder, unspecified: Secondary | ICD-10-CM

## 2021-04-22 ENCOUNTER — Emergency Department (HOSPITAL_COMMUNITY)
Admission: EM | Admit: 2021-04-22 | Discharge: 2021-04-22 | Disposition: A | Discharge status: Skilled Nursing Facility | Payer: Medicare Other | Attending: Emergency Medicine | Admitting: Emergency Medicine

## 2021-04-22 DIAGNOSIS — R55 Syncope and collapse: Secondary | ICD-10-CM | POA: Diagnosis not present

## 2021-04-22 DIAGNOSIS — F0390 Unspecified dementia without behavioral disturbance: Secondary | ICD-10-CM | POA: Diagnosis not present

## 2021-04-22 DIAGNOSIS — I10 Essential (primary) hypertension: Secondary | ICD-10-CM | POA: Insufficient documentation

## 2021-04-22 LAB — BASIC METABOLIC PANEL
Anion gap: 7 (ref 5–15)
BUN: 13 mg/dL (ref 8–23)
CO2: 27 mmol/L (ref 22–32)
Calcium: 9.1 mg/dL (ref 8.9–10.3)
Chloride: 100 mmol/L (ref 98–111)
Creatinine, Ser: 1.12 mg/dL — ABNORMAL HIGH (ref 0.44–1.00)
GFR, Estimated: 46 mL/min — ABNORMAL LOW (ref >60)
Glucose, Bld: 103 mg/dL — ABNORMAL HIGH (ref 70–99)
Potassium: 4.1 mmol/L (ref 3.5–5.1)
Sodium: 134 mmol/L — ABNORMAL LOW (ref 135–145)

## 2021-04-22 LAB — CBC WITH DIFFERENTIAL/PLATELET
Abs Immature Granulocytes: 0.04 10*3/uL (ref 0.00–0.07)
Basophils Absolute: 0 10*3/uL (ref 0.0–0.1)
Basophils Relative: 1 %
Eosinophils Absolute: 0.1 10*3/uL (ref 0.0–0.5)
Eosinophils Relative: 2 %
HCT: 31.4 % — ABNORMAL LOW (ref 36.0–46.0)
Hemoglobin: 9.4 g/dL — ABNORMAL LOW (ref 12.0–15.0)
Immature Granulocytes: 1 %
Lymphocytes Relative: 19 %
Lymphs Abs: 1 10*3/uL (ref 0.7–4.0)
MCH: 28.1 pg (ref 26.0–34.0)
MCHC: 29.9 g/dL — ABNORMAL LOW (ref 30.0–36.0)
MCV: 94 fL (ref 80.0–100.0)
Monocytes Absolute: 0.7 10*3/uL (ref 0.1–1.0)
Monocytes Relative: 14 %
Neutro Abs: 3.4 10*3/uL (ref 1.7–7.7)
Neutrophils Relative %: 63 %
Platelets: 215 10*3/uL (ref 150–400)
RBC: 3.34 MIL/uL — ABNORMAL LOW (ref 3.87–5.11)
RDW: 14.9 % (ref 11.5–15.5)
WBC: 5.3 10*3/uL (ref 4.0–10.5)
nRBC: 0 % (ref 0.0–0.2)

## 2021-04-22 NOTE — ED Notes (Signed)
Attempted to call facility x2. No answer

## 2021-04-22 NOTE — Discharge Instructions (Signed)
Call your primary care doctor or specialist as discussed in the next 2-3 days.   Return immediately back to the ER if:  Your symptoms worsen within the next 12-24 hours. You develop new symptoms such as new fevers, persistent vomiting, new pain, shortness of breath, or new weakness or numbness, or if you have any other concerns.  

## 2021-04-22 NOTE — ED Triage Notes (Addendum)
PT BIB [facility] from [facility] for a syncopal episode.  They report pt was on toilet and staff witnessed syncopal episode.  Staff initially thought she was pulseless and call came out as a cardiac arrest. EMS palpated a thready pulse around 50bpm.  They moved her to stetcher and placed her in [place] where her BP was 80/40.  After 400 mL of fluid they got 130/70 and HR 72.  Patient had bowel movement on stretcher and was much more alert on arrival.  EMS reports she was a little combative and liked to pinch. Pt has dementia at baseline.  EMS placed a 20G and secured it with lots of kling.

## 2021-04-22 NOTE — ED Notes (Signed)
Patient verbalizes understanding of discharge instructions. Opportunity for questioning and answers were provided. Armband removed by staff, pt discharged from ED via [place]. Attempted to call report to facility 3 times. No answer. Pt brief changed prior to departure.

## 2021-04-22 NOTE — ED Provider Notes (Signed)
[facility] EMERGENCY DEPARTMENT Provider Note   CSN: [id number] Arrival date & time: 04/22/21  1522     History No chief complaint on file.   Terri Green is a 87 y.o. female.  Patient with history of dementia, presents with syncopal episode.  Reportedly at the facility she was on the toilet straining to have a bowel movement, when the staff states that she slumped over became unresponsive for 3 to 5 minutes.  They noticed low blood pressure and unresponsiveness.  Symptoms have since resolved.  Patient is back to her baseline.  No reports of pain no reports of fevers or cough or vomiting or diarrhea given.      Past Medical History:  Diagnosis Date   Bradycardia    Dehydration    Dementia ([place])    Unresponsive episode     Patient Active Problem List   Diagnosis Date Noted   Constipation 05/24/2019   Anemia 05/06/2018   Vitamin D deficiency 07/18/2016   Anxiety 06/21/2015   Allergic rhinitis due to pollen 10/19/2014   Dementia ([place]) with behaviors  09/07/2014   Poor appetite 09/07/2014   Pure hypercholesterolemia, unspecified 06/01/2014   Benign essential hypertension 06/01/2014    No past surgical history on file.   OB History   No obstetric history on file.     No family history on file.  Social History   Tobacco Use   Smoking status: Never   Smokeless tobacco: Never  Vaping Use   Vaping Use: Never used  Substance Use Topics   Alcohol use: Never   Drug use: Never    Home Medications Prior to Admission medications   Medication Sig Start Date End Date Taking? Authorizing Provider  acetaminophen (TYLENOL) 325 MG tablet Take 1 tablet (325 mg total) by mouth every 6 (six) hours as needed. 06/19/20  Yes Lauree Chandler, NP  ALPRAZolam Duanne Moron) 0.25 MG tablet Take 1 tablet (0.25 mg total) by mouth daily as needed for anxiety. 12/13/20  Yes Ngetich, Dinah C, NP  Docusate Sodium 100 MG capsule Take 100 mg by mouth daily.   Yes  [provider]  Melatonin 5 MG CHEW Chew 1 tablet by mouth at bedtime.   Yes [provider]  polyethylene glycol (MIRALAX / GLYCOLAX) 17 g packet Take 17 g by mouth daily.   Yes [provider]  QUEtiapine (SEROQUEL) 50 MG tablet Take 1 tablet (50 mg total) by mouth at bedtime. APPOINTMENT DUE 02/06/21  Yes Lauree Chandler, NP  sertraline (ZOLOFT) 100 MG tablet Take Two tablets by mouth daily. Needs an appointment before anymore future refills. 03/08/21  Yes Lauree Chandler, NP    Allergies    Penicillins  Review of Systems   Review of Systems  Unable to perform ROS: Dementia   Physical Exam Updated Vital Signs BP (!) 166/78   Pulse 100   Temp (!) 97.5 F (36.4 C) (Oral)   Resp 14   SpO2 100%   Physical Exam Constitutional:      General: She is not in acute distress.    Appearance: Normal appearance.  HENT:     Head: Normocephalic.     Nose: Nose normal.  Eyes:     Extraocular Movements: Extraocular movements intact.  Cardiovascular:     Rate and Rhythm: Normal rate.  Pulmonary:     Effort: Pulmonary effort is normal.  Musculoskeletal:        General: Normal range of motion.  Cervical back: Normal range of motion.  Neurological:     General: No focal deficit present.     Mental Status: She is alert. Mental status is at baseline.    ED Results / Procedures / Treatments   Labs (all labs ordered are listed, but only abnormal results are displayed) Labs Reviewed  CBC WITH DIFFERENTIAL/PLATELET - Abnormal; Notable for the following components:      Result Value   RBC 3.34 (*)    Hemoglobin 9.4 (*)    HCT 31.4 (*)    MCHC 29.9 (*)    All other components within normal limits  BASIC METABOLIC PANEL - Abnormal; Notable for the following components:   Sodium 134 (*)    Glucose, Bld 103 (*)    Creatinine, Ser 1.12 (*)    GFR, Estimated 46 (*)    All other components within normal limits    EKG EKG  Interpretation  Date/Time:  Sunday April 22 2021 15:38:14 EST Ventricular Rate:  70 PR Interval:  144 QRS Duration: 88 QT Interval:  423 QTC Calculation: 457 R Axis:   -34 Text Interpretation: Sinus rhythm Left axis deviation Consider anterior infarct Confirmed by Thamas Jaegers ([id number]) on 04/22/2021 4:27:56 PM  Radiology No results found.  Procedures Procedures   Medications Ordered in ED Medications - No data to display  ED Course  I have reviewed the triage vital signs and the nursing notes.  Pertinent labs & imaging results that were available during my care of the patient were reviewed by me and considered in my medical decision making (see chart for details).    MDM Rules/Calculators/A&P                           Vital signs arrival within normal limits.  Basic labs are sent is unremarkable.  EKG shows sinus rhythm, rate of 70 bpm, no ST elevations or depressions noted.  Family is at bedside, describes the patient is back to her mental status that is normal for her.  Given the history I feel this is likely a vasovagal syncope due to straining at the toilet.  Family states the patient is a DNR/DNI patient prefer to take her back to the facility which I feel is appropriate.  Advised outpatient follow-up with her doctors as needed within the week, advised immediate return for worsening symptoms or any additional concerns.  Final Clinical Impression(s) / ED Diagnoses Final diagnoses:  Dementia, unspecified dementia severity, unspecified dementia type, unspecified whether behavioral, psychotic, or mood disturbance or anxiety ([place])  Syncope, unspecified syncope type    Rx / DC Orders ED Discharge Orders     None        Luna Fuse, MD 04/22/21 1811

## 2021-04-22 NOTE — ED Notes (Signed)
Attempted to call [facility]. No answer.

## 2021-04-22 NOTE — ED Notes (Signed)
[facility] was called next on list

## 2021-04-23 ENCOUNTER — Encounter: Payer: Self-pay | Admitting: Nurse Practitioner

## 2021-04-23 LAB — CBG MONITORING, ED: Glucose-Capillary: 101 mg/dL — ABNORMAL HIGH (ref 70–99)

## 2021-04-23 MED ORDER — SERTRALINE HCL 100 MG PO TABS: ORAL_TABLET | ORAL | 1 refills | Status: DC

## 2021-04-23 NOTE — Telephone Encounter (Signed)
Message sent to Lauree Chandler, NP to advise

## 2021-04-24 ENCOUNTER — Other Ambulatory Visit: Payer: Self-pay

## 2021-04-24 ENCOUNTER — Telehealth: Payer: Self-pay

## 2021-04-24 ENCOUNTER — Telehealth (INDEPENDENT_AMBULATORY_CARE_PROVIDER_SITE_OTHER): Payer: Medicare Other | Admitting: Nurse Practitioner

## 2021-04-24 ENCOUNTER — Encounter: Payer: Self-pay | Admitting: Nurse Practitioner

## 2021-04-24 DIAGNOSIS — G301 Alzheimer's disease with late onset: Secondary | ICD-10-CM | POA: Diagnosis not present

## 2021-04-24 DIAGNOSIS — K5901 Slow transit constipation: Secondary | ICD-10-CM | POA: Diagnosis not present

## 2021-04-24 DIAGNOSIS — G4700 Insomnia, unspecified: Secondary | ICD-10-CM | POA: Diagnosis not present

## 2021-04-24 DIAGNOSIS — F419 Anxiety disorder, unspecified: Secondary | ICD-10-CM

## 2021-04-24 DIAGNOSIS — F02818 Dementia in other diseases classified elsewhere, unspecified severity, with other behavioral disturbance: Secondary | ICD-10-CM

## 2021-04-24 MED ORDER — UNABLE TO FIND: 0 refills | Status: DC

## 2021-04-24 NOTE — Progress Notes (Signed)
Careteam: Patient Care Team: Lauree Chandler, NP as PCP - General (Geriatric Medicine)  Advanced Directive information    Allergies  Allergen Reactions   Penicillins     Chief Complaint  Patient presents with   Follow-up    Follow up for medication refill. Patient needs refill on Zoloft. Follow up from ED visit.      HPI: Patient is a 87 y.o. female for routine follow up via video visit.  Pt with hx of advance dementia, anxiety.  On 11/27 she had an syncopal episode while having a BM and she was sent to the hospital. Daughter request for her not to be sent to the hospital and comfort measures only.  This is the second episode in 2 years. Happens when she is constipation. She has stool softener that she is taking regularly. Daughter feels like she had a diet change over thanksgiving and that may have thrown system off.  Generally without symptoms of constipation.   Anxiety- well controlled on zoloft.   Dementia- no changes in behaviors or agitation.  Continues in memory care. Needing help bathing and dressing.  Continues to use walker. Occasional fall, last was the last week but no pain or injury   Daughter without any concerns or questions at this time.  Palliative care following routinely   Review of Systems:  Review of Systems  Unable to perform ROS: Dementia   Past Medical History:  Diagnosis Date   Bradycardia    Dehydration    Dementia (HCC)    Unresponsive episode    No past surgical history on file. Social History:   reports that she has never smoked. She has never used smokeless tobacco. She reports that she does not drink alcohol and does not use drugs.  No family history on file.  Medications: Patient's Medications  New Prescriptions   No medications on file  Previous Medications   ACETAMINOPHEN (TYLENOL) 325 MG TABLET    Take 1 tablet (325 mg total) by mouth every 6 (six) hours as needed.   ALPRAZOLAM (XANAX) 0.25 MG TABLET    Take 1 tablet  (0.25 mg total) by mouth daily as needed for anxiety.   DOCUSATE SODIUM 100 MG CAPSULE    Take 100 mg by mouth daily.   MELATONIN 5 MG CHEW    Chew 1 tablet by mouth at bedtime.   POLYETHYLENE GLYCOL (MIRALAX / GLYCOLAX) 17 G PACKET    Take 17 g by mouth daily.   QUETIAPINE (SEROQUEL) 50 MG TABLET    Take 1 tablet (50 mg total) by mouth at bedtime. APPOINTMENT DUE   SERTRALINE (ZOLOFT) 100 MG TABLET    Take Two tablets by mouth daily. Needs an appointment before anymore future refills.  Modified Medications   No medications on file  Discontinued Medications   No medications on file    Physical Exam:  There were no vitals filed for this visit. There is no height or weight on file to calculate BMI. Wt Readings from Last 3 Encounters:  12/07/20 118 lb 6.4 oz (53.7 kg)  04/27/20 113 lb 15.7 oz (51.7 kg)  12/15/19 114 lb (51.7 kg)      Labs reviewed: Basic Metabolic Panel: Recent Labs    04/22/21 1615  NA 134*  K 4.1  CL 100  CO2 27  GLUCOSE 103*  BUN 13  CREATININE 1.12*  CALCIUM 9.1   Liver Function Tests: No results for input(s): AST, ALT, ALKPHOS, BILITOT, PROT, ALBUMIN in the  last 8760 hours. No results for input(s): LIPASE, AMYLASE in the last 8760 hours. No results for input(s): AMMONIA in the last 8760 hours. CBC: Recent Labs    04/22/21 1615  WBC 5.3  NEUTROABS 3.4  HGB 9.4*  HCT 31.4*  MCV 94.0  PLT 215   Lipid Panel: No results for input(s): CHOL, HDL, LDLCALC, TRIG, CHOLHDL, LDLDIRECT in the last 8760 hours. TSH: No results for input(s): TSH in the last 8760 hours. A1C: No results found for: HGBA1C   Assessment/Plan 1. Anxiety Well controlled on zoloft 100 mg daily.   2. Late onset Alzheimer's disease with behavioral disturbance (HCC) Stable, no acute changes in cognitive or functional status. Continues in memory care with daughter living close by.   3. Slow transit constipation -controlled on stool softener and miralax.   4. Insomnia,  unspecified type -stable at this time. Uses melatonin 5 mg at bedtime.   Followed by pallative care at facility, okay for yearly visit and PRN.   Carlos American. Harle Battiest  [facility] [phone]    Virtual Visit via video  I connected with patient on 04/24/21 at  2:15 PM EST by video and verified that I am speaking with the correct person using two identifiers.  Location: Patient: home Provider: [facility]    I discussed the limitations, risks, security and privacy concerns of performing an evaluation and management service by telephone and the availability of in person appointments. I also discussed with the patient that there may be a patient responsible charge related to this service. The patient expressed understanding and agreed to proceed.   I discussed the assessment and treatment plan with the patient. The patient was provided an opportunity to ask questions and all were answered. The patient agreed with the plan and demonstrated an understanding of the instructions.   The patient was advised to call back or seek an in-person evaluation if the symptoms worsen or if the condition fails to improve as anticipated.  I provided 16 minutes of non-face-to-face time during this encounter.  Carlos American. Harle Battiest Avs printed and mailed

## 2021-04-24 NOTE — Telephone Encounter (Signed)
[name], tamura are scheduled for a virtual visit with your provider today.    Just as we do with appointments in the office, we must obtain your consent to participate.  Your consent will be active for this visit and any virtual visit you may have with one of our providers in the next 365 days.    If you have a MyChart account, I can also send a copy of this consent to you electronically.  All virtual visits are billed to your insurance company just like a traditional visit in the office.  As this is a virtual visit, video technology does not allow for your provider to perform a traditional examination.  This may limit your provider's ability to fully assess your condition.  If your provider identifies any concerns that need to be evaluated in person or the need to arrange testing such as labs, EKG, etc, we will make arrangements to do so.    Although advances in technology are sophisticated, we cannot ensure that it will always work on either your end or our end.  If the connection with a video visit is poor, we may have to switch to a telephone visit.  With either a video or telephone visit, we are not always able to ensure that we have a secure connection.   I need to obtain your verbal consent now.   Are you willing to proceed with your visit today?   Terri Green has provided verbal consent on 04/24/2021 for a virtual visit (video or telephone).   Carroll Kinds, CMA 04/24/2021  2:26 PM

## 2021-04-24 NOTE — Telephone Encounter (Signed)
Order pending  Please review and add diagnosis within order and I will print, stamp, and fax.

## 2021-04-24 NOTE — Progress Notes (Signed)
This service is provided via telemedicine  No vital signs collected/recorded due to the encounter was a telemedicine visit.   Location of patient (ex: home, work):  Home  Patient consents to a telephone visit:  Yes, see encounter dated 04/24/2021  Location of the provider (ex: office, home):  [place]  Name of any referring provider:  N/A  Names of all persons participating in the telemedicine service and their role in the encounter:  Sherrie Mustache, Nurse Practitioner, Carroll Kinds, CMA, and patient.   Time spent on call:  9 minutes with medical assistant

## 2021-05-02 ENCOUNTER — Encounter: Payer: Self-pay | Admitting: Nurse Practitioner

## 2021-05-02 ENCOUNTER — Ambulatory Visit (INDEPENDENT_AMBULATORY_CARE_PROVIDER_SITE_OTHER): Payer: Medicare Other | Admitting: Nurse Practitioner

## 2021-05-02 ENCOUNTER — Other Ambulatory Visit: Payer: Self-pay

## 2021-05-02 DIAGNOSIS — Z0000 Encounter for general adult medical examination without abnormal findings: Secondary | ICD-10-CM

## 2021-05-02 NOTE — Progress Notes (Signed)
This service is provided via telemedicine  No vital signs collected/recorded due to the encounter was a telemedicine visit.   Location of patient (ex: home, work):  Home  Patient consents to a telephone visit:  Yes, see encounter dated 04/24/2021  Location of the provider (ex: office, home):  [facility]  Name of any referring provider:  N/A  Names of all persons participating in the telemedicine service and their role in the encounter:  Sherrie Mustache, Nurse Practitioner, Carroll Kinds, CMA, and patient.   Time spent on call:  8 minutes with medical assistant

## 2021-05-02 NOTE — Progress Notes (Signed)
Subjective:   Terri Green is a 87 y.o. female who presents for Medicare Annual (Subsequent) preventive examination.  Review of Systems     Cardiac Risk Factors include: advanced age (>27men, >89 women);sedentary lifestyle     Objective:    There were no vitals filed for this visit. There is no height or weight on file to calculate BMI.  Advanced Directives 05/02/2021 11/20/2020 04/27/2020 12/15/2019 11/01/2019 06/25/2019 02/09/2019  Does Patient Have a Medical Advance Directive? Yes No Yes Yes Yes Yes Yes  Type of Paramedic of [place];Living will - [place];Out of facility DNR (pink MOST or yellow form) [place];Out of facility DNR (pink MOST or yellow form) Living will;Healthcare Power of [place];Out of facility DNR (pink MOST or yellow form) [place];Living will;Out of facility DNR (pink MOST or yellow form) Living will  Does patient want to make changes to medical advance directive? No - Patient declined - No - Patient declined No - Patient declined No - Patient declined No - Patient declined No - Guardian declined  Copy of [place] in Chart? Yes - validated most recent copy scanned in chart (See row information) - No - copy requested Yes - validated most recent copy scanned in chart (See row information) Yes - validated most recent copy scanned in chart (See row information) Yes - validated most recent copy scanned in chart (See row information) -  Would patient like information on creating a medical advance directive? - No - Patient declined No - Patient declined - - - -  Pre-existing out of facility DNR order (yellow form or pink MOST form) - - - Yellow form placed in chart (order not valid for inpatient use);Pink MOST form placed in chart (order not valid for inpatient use) - Pink MOST/Yellow Form most recent copy in chart - Physician notified to receive inpatient order -     Current Medications (verified) Outpatient Encounter Medications as of 05/02/2021  Medication Sig   acetaminophen (TYLENOL) 325 MG tablet Take 1 tablet (325 mg total) by mouth every 6 (six) hours as needed.   ALPRAZolam (XANAX) 0.25 MG tablet Take 1 tablet (0.25 mg total) by mouth daily as needed for anxiety.   Docusate Sodium 100 MG capsule Take 100 mg by mouth daily.   Melatonin 5 MG CHEW Chew 1 tablet by mouth at bedtime.   polyethylene glycol (MIRALAX / GLYCOLAX) 17 g packet Take 17 g by mouth daily.   QUEtiapine (SEROQUEL) 50 MG tablet Take 1 tablet (50 mg total) by mouth at bedtime. APPOINTMENT DUE   sertraline (ZOLOFT) 100 MG tablet Take Two tablets by mouth daily. Needs an appointment before anymore future refills.   UNABLE TO FIND Hospice Care   No facility-administered encounter medications on file as of 05/02/2021.    Allergies (verified) Penicillins   History: Past Medical History:  Diagnosis Date   Bradycardia    Dehydration    Dementia (HCC)    Unresponsive episode    History reviewed. No pertinent surgical history. History reviewed. No pertinent family history. Social History   Socioeconomic History   Marital status: Widowed    Spouse name: Not on file   Number of children: Not on file   Years of education: Not on file   Highest education level: Not on file  Occupational History   Not on file  Tobacco Use   Smoking status: Never   Smokeless tobacco: Never  Vaping Use  Vaping Use: Never used  Substance and Sexual Activity   Alcohol use: Never   Drug use: Never   Sexual activity: Not on file  Other Topics Concern   Not on file  Social History Narrative   Social History      Diet? General       Do you drink/eat things with caffeine? No    Marital status?                  Divorced                   What year were you married?  1949      Do you live in a house, apartment, assisted living, condo, trailer, etc.?  house      Is it one or more  stories? One       How many persons live in your home? 2      Do you have any pets in your home? (please list)  No       Highest level of education completed? 12th grade       Current or past profession:      Do you exercise?      Moderate                                 Type & how often? Walk       Advanced Directives      Do you have a living will? Yes      Do you have a DNR form?                                  If not, do you want to discuss one? Yes      Do you have signed POA/HPOA for forms? Yes      Functional Status      Do you have difficulty bathing or dressing yourself? Yes      Do you have difficulty preparing food or eating? Yes       Do you have difficulty managing your medications? Yes       Do you have difficulty managing your finances? Yes       Do you have difficulty affording your medications? No       Social Determinants of Radio broadcast assistant Strain: Not on file  Food Insecurity: Not on file  Transportation Needs: Not on file  Physical Activity: Not on file  Stress: Not on file  Social Connections: Not on file    Tobacco Counseling Counseling given: Not Answered   Clinical Intake:  Pre-visit preparation completed: Yes  Pain : No/denies pain     BMI - recorded: 20 Diabetes: No  How often do you need to have someone help you when you read instructions, pamphlets, or other written materials from your doctor or pharmacy?: 5 - [place]         Activities of Daily Living In your present state of health, do you have any difficulty performing the following activities: 05/02/2021  Hearing? N  Vision? N  Difficulty concentrating or making decisions? Y  Walking or climbing stairs? Y  Dressing or bathing? Y  Doing errands, shopping? Y  Preparing Food and eating ? Y  Comment does not prepare food  Using the Toilet? Y  In the past  six months, have you accidently leaked urine? Y  Do you have problems with loss of  bowel control? N  Managing your Medications? Y  Managing your Finances? Y  Housekeeping or managing your Housekeeping? Y  Some recent data might be hidden    Patient Care Team: Lauree Chandler, NP as PCP - General (Geriatric Medicine)  Indicate any recent Medical Services you may have received from other than [facility] providers in the past year (date may be approximate).     Assessment:   This is a routine wellness examination for [place].  Hearing/Vision screen Hearing Screening - Comments:: No hearing problems Vision Screening - Comments:: No vision problems. Patient doe not have an eye doctor  Dietary issues and exercise activities discussed: Current Exercise Habits: The patient does not participate in regular exercise at present   Goals Addressed   None    Depression Screen PHQ 2/9 Scores 05/02/2021 01/01/2019 12/23/2018  PHQ - 2 Score 0 0 0    Fall Risk Fall Risk  05/02/2021 12/15/2019 11/01/2019 06/25/2019 01/01/2019  Falls in the past year? 1 0 0 0 1  Number falls in past yr: 1 0 0 0 0  Injury with Fall? 0 0 0 0 1  Comment - - - - bump on top of forehead  Risk for fall due to : History of fall(s) - - - -  Follow up Falls evaluation completed - - - -    FALL RISK PREVENTION PERTAINING TO THE HOME:  Any stairs in or around the home? No  If so, are there any without handrails? No  Home free of loose throw rugs in walkways, pet beds, electrical cords, etc? Yes  Adequate lighting in your home to reduce risk of falls? Yes   ASSISTIVE DEVICES UTILIZED TO PREVENT FALLS:  Life alert? No  Use of a cane, walker or w/c? Yes  Grab bars in the bathroom? Yes  Shower chair or bench in shower? Yes  Elevated toilet seat or a handicapped toilet? No   TIMED UP AND GO:  Was the test performed? No .   Cognitive Function:        Immunizations Immunization History  Administered Date(s) Administered   Fluad Quad(high Dose 65+) 03/04/2019, 03/06/2021   Influenza Split  02/24/2014, 05/24/2015   Influenza, High Dose Seasonal PF 02/20/2017, 02/27/2018   Influenza,inj,Quad PF,6+ Mos 02/24/2014, 05/24/2015, 03/01/2016   Influenza,inj,quad, With Preservative 02/20/2017   Influenza-Unspecified 03/04/2020   Moderna Covid-19 Vaccine Bivalent Booster 60yrs & up 04/09/2021   Moderna Sars-Covid-2 Vaccination 06/08/2019, 07/06/2019   PPD Test 12/28/2014, 05/24/2015, 10/14/2017, 11/01/2019   Pneumococcal Conjugate-13 11/30/2014   Pneumococcal Polysaccharide-23 12/25/2012   Zoster Recombinat (Shingrix) 01/26/2015    TDAP status: Due, Education has been provided regarding the importance of this vaccine. Advised may receive this vaccine at local pharmacy or Health Dept. Aware to provide a copy of the vaccination record if obtained from local pharmacy or Health Dept. Verbalized acceptance and understanding.  Flu Vaccine status: Up to date  Pneumococcal vaccine status: Up to date  Covid-19 vaccine status: Completed vaccines  Qualifies for Shingles Vaccine? Yes   Zostavax completed Yes   Shingrix Completed?: Yes  Screening Tests Health Maintenance  Topic Date Due   TETANUS/TDAP  Never done   Zoster Vaccines- Shingrix (2 of 2) 03/23/2015   Pneumonia Vaccine 68+ Years old  Completed   INFLUENZA VACCINE  Completed   COVID-19 Vaccine  Completed   HPV VACCINES  Aged Out  DEXA SCAN  Discontinued    Health Maintenance  Health Maintenance Due  Topic Date Due   TETANUS/TDAP  Never done   Zoster Vaccines- Shingrix (2 of 2) 03/23/2015    Colorectal cancer screening: No longer required.   Mammogram status: No longer required due to aged.  Lung Cancer Screening: (Low Dose CT Chest recommended if Age 12-80 years, 30 pack-year currently smoking OR have quit w/in 15years.) does not qualify.   Lung Cancer Screening Referral: na  Additional Screening:  Hepatitis C Screening: does not qualify; Completed   Vision Screening: Recommended annual ophthalmology exams  for early detection of glaucoma and other disorders of the eye. Is the patient up to date with their annual eye exam?  No  Who is the provider or what is the name of the office in which the patient attends annual eye exams? none If pt is not established with a provider, would they like to be referred to a provider to establish care? No .   Dental Screening: Recommended annual dental exams for proper oral hygiene  Community Resource Referral / Chronic Care Management: CRR required this visit?  No   CCM required this visit?  No      Plan:     I have personally reviewed and noted the following in the patient's chart:   Medical and social history Use of alcohol, tobacco or illicit drugs  Current medications and supplements including opioid prescriptions.  Functional ability and status Nutritional status Physical activity Advanced directives List of other physicians Hospitalizations, surgeries, and ER visits in previous 12 months Vitals Screenings to include cognitive, depression, and falls Referrals and appointments  In addition, I have reviewed and discussed with patient certain preventive protocols, quality metrics, and best practice recommendations. A written personalized care plan for preventive services as well as general preventive health recommendations were provided to patient.     Lauree Chandler, NP   05/03/2021    Virtual Visit via Telephone Note  I connected withNAME@ on 05/03/21 at  4:15 PM EST by telephone and verified that I am speaking with the correct person using two identifiers.  Location: Patient: home Provider: [place]   I discussed the limitations, risks, security and privacy concerns of performing an evaluation and management service by telephone and the availability of in person appointments. I also discussed with the patient that there may be a patient responsible charge related to this service. The patient expressed understanding and agreed to  proceed.   I discussed the assessment and treatment plan with the patient. The patient was provided an opportunity to ask questions and all were answered. The patient agreed with the plan and demonstrated an understanding of the instructions.   The patient was advised to call back or seek an in-person evaluation if the symptoms worsen or if the condition fails to improve as anticipated.  I provided 14 minutes of non-face-to-face time during this encounter.  [employer]. Harle Battiest Avs printed and mailed

## 2021-05-02 NOTE — Patient Instructions (Signed)
[name] , Thank you for taking time to come for your Medicare Wellness Visit. I appreciate your ongoing commitment to your health goals. Please review the following plan we discussed and let me know if I can assist you in the future.   Screening recommendations/referrals: Colonoscopy aged out Mammogram aged out Bone Density declined.  Recommended yearly ophthalmology/optometry visit for glaucoma screening and checkup Recommended yearly dental visit for hygiene and checkup  Vaccinations: Influenza vaccine up to date- please let us know the date of this.  Pneumococcal vaccine up to date Tdap vaccine RECOMMENDED- can get at local pharmacy Shingles vaccine please let us know the dates of vaccines if you can find them  COVID- please let us know the date of vaccine if you can find them    Advanced directives: on file.   Conditions/risks identified: dementia- progressive memory loss  Next appointment:  yearly    Preventive Care 27 Years and Older, Female Preventive care refers to lifestyle choices and visits with your health care provider that can promote health and wellness. What does preventive care include? A yearly physical exam. This is also called an annual well check. Dental exams once or twice a year. Routine eye exams. Ask your health care provider how often you should have your eyes checked. Personal lifestyle choices, including: Daily care of your teeth and gums. Regular physical activity. Eating a healthy diet. Avoiding tobacco and drug use. Limiting alcohol use. Practicing safe sex. Taking low-dose aspirin every day. Taking vitamin and mineral supplements as recommended by your health care provider. What happens during an annual well check? The services and screenings done by your health care provider during your annual well check will depend on your age, overall health, lifestyle risk factors, and family history of disease. Counseling  Your health care provider  may ask you questions about your: Alcohol use. Tobacco use. Drug use. Emotional well-being. Home and relationship well-being. Sexual activity. Eating habits. History of falls. Memory and ability to understand (cognition). Work and work Statistician. Reproductive health. Screening  You may have the following tests or measurements: Height, weight, and BMI. Blood pressure. Lipid and cholesterol levels. These may be checked every 5 years, or more frequently if you are over 32 years old. Skin check. Lung cancer screening. You may have this screening every year starting at age 1 if you have a 30-pack-year history of smoking and currently smoke or have quit within the past 15 years. Fecal occult blood test (FOBT) of the stool. You may have this test every year starting at age 32. Flexible sigmoidoscopy or colonoscopy. You may have a sigmoidoscopy every 5 years or a colonoscopy every 10 years starting at age 72. Hepatitis C blood test. Hepatitis B blood test. Sexually transmitted disease (STD) testing. Diabetes screening. This is done by checking your blood sugar (glucose) after you have not eaten for a while (fasting). You may have this done every 1-3 years. Bone density scan. This is done to screen for osteoporosis. You may have this done starting at age 107. Mammogram. This may be done every 1-2 years. Talk to your health care provider about how often you should have regular mammograms. Talk with your health care provider about your test results, treatment options, and if necessary, the need for more tests. Vaccines  Your health care provider may recommend certain vaccines, such as: Influenza vaccine. This is recommended every year. Tetanus, diphtheria, and acellular pertussis (Tdap, Td) vaccine. You may need a Td booster every 10 years.  Zoster vaccine. You may need this after age 61. Pneumococcal 13-valent conjugate (PCV13) vaccine. One dose is recommended after age 58. Pneumococcal  polysaccharide (PPSV23) vaccine. One dose is recommended after age 7. Talk to your health care provider about which screenings and vaccines you need and how often you need them. This information is not intended to replace advice given to you by your health care provider. Make sure you discuss any questions you have with your health care provider. Document Released: 06/09/2015 Document Revised: 01/31/2016 Document Reviewed: 03/14/2015 Elsevier Interactive Patient Education  2017 [employer] Prevention in the Home Falls can cause injuries. They can happen to people of all ages. There are many things you can do to make your home safe and to help prevent falls. What can I do on the outside of my home? Regularly fix the edges of walkways and driveways and fix any cracks. Remove anything that might make you trip as you walk through a door, such as a raised step or threshold. Trim any bushes or trees on the path to your home. Use bright outdoor lighting. Clear any walking paths of anything that might make someone trip, such as rocks or tools. Regularly check to see if handrails are loose or broken. Make sure that both sides of any steps have handrails. Any raised decks and porches should have guardrails on the edges. Have any leaves, snow, or ice cleared regularly. Use sand or salt on walking paths during winter. Clean up any spills in your garage right away. This includes oil or grease spills. What can I do in the bathroom? Use night lights. Install grab bars by the toilet and in the tub and shower. Do not use towel bars as grab bars. Use non-skid mats or decals in the tub or shower. If you need to sit down in the shower, use a plastic, non-slip stool. Keep the floor dry. Clean up any water that spills on the floor as soon as it happens. Remove soap buildup in the tub or shower regularly. Attach bath mats securely with double-sided non-slip rug tape. Do not have throw rugs and other  things on the floor that can make you trip. What can I do in the bedroom? Use night lights. Make sure that you have a light by your bed that is easy to reach. Do not use any sheets or blankets that are too big for your bed. They should not hang down onto the floor. Have a firm chair that has side arms. You can use this for support while you get dressed. Do not have throw rugs and other things on the floor that can make you trip. What can I do in the kitchen? Clean up any spills right away. Avoid walking on wet floors. Keep items that you use a lot in easy-to-reach places. If you need to reach something above you, use a strong step stool that has a grab bar. Keep electrical cords out of the way. Do not use floor polish or wax that makes floors slippery. If you must use wax, use non-skid floor wax. Do not have throw rugs and other things on the floor that can make you trip. What can I do with my stairs? Do not leave any items on the stairs. Make sure that there are handrails on both sides of the stairs and use them. Fix handrails that are broken or loose. Make sure that handrails are as long as the stairways. Check any carpeting to make sure that  it is firmly attached to the stairs. Fix any carpet that is loose or worn. Avoid having throw rugs at the top or bottom of the stairs. If you do have throw rugs, attach them to the floor with carpet tape. Make sure that you have a light switch at the top of the stairs and the bottom of the stairs. If you do not have them, ask someone to add them for you. What else can I do to help prevent falls? Wear shoes that: Do not have high heels. Have rubber bottoms. Are comfortable and fit you well. Are closed at the toe. Do not wear sandals. If you use a stepladder: Make sure that it is fully opened. Do not climb a closed stepladder. Make sure that both sides of the stepladder are locked into place. Ask someone to hold it for you, if possible. Clearly  mark and make sure that you can see: Any grab bars or handrails. First and last steps. Where the edge of each step is. Use tools that help you move around (mobility aids) if they are needed. These include: Canes. Walkers. Scooters. Crutches. Turn on the lights when you go into a dark area. Replace any light bulbs as soon as they burn out. Set up your furniture so you have a clear path. Avoid moving your furniture around. If any of your floors are uneven, fix them. If there are any pets around you, be aware of where they are. Review your medicines with your doctor. Some medicines can make you feel dizzy. This can increase your chance of falling. Ask your doctor what other things that you can do to help prevent falls. This information is not intended to replace advice given to you by your health care provider. Make sure you discuss any questions you have with your health care provider. Document Released: 03/09/2009 Document Revised: 10/19/2015 Document Reviewed: 06/17/2014 Elsevier Interactive Patient Education  2017 [employer].

## 2021-05-03 ENCOUNTER — Encounter: Payer: Self-pay | Admitting: Nurse Practitioner

## 2021-05-07 ENCOUNTER — Other Ambulatory Visit: Payer: Self-pay | Admitting: Nurse Practitioner

## 2021-05-07 DIAGNOSIS — F02818 Dementia in other diseases classified elsewhere, unspecified severity, with other behavioral disturbance: Secondary | ICD-10-CM

## 2021-05-07 DIAGNOSIS — G301 Alzheimer's disease with late onset: Secondary | ICD-10-CM

## 2021-05-22 ENCOUNTER — Telehealth: Payer: Self-pay

## 2021-05-22 NOTE — Telephone Encounter (Signed)
Received a message that patient needs a Palliative care visit. Phone call placed to facility to check in on patient. VM left.

## 2021-05-23 ENCOUNTER — Non-Acute Institutional Stay: Payer: Medicare Other

## 2021-05-23 VITALS — BP 120/70 | HR 76 | Resp 16 | Wt 126.5 lb

## 2021-05-23 DIAGNOSIS — Z515 Encounter for palliative care: Secondary | ICD-10-CM

## 2021-05-24 ENCOUNTER — Other Ambulatory Visit: Payer: Self-pay

## 2021-05-24 NOTE — Progress Notes (Signed)
PATIENT NAME: Terri Green DOB: December 19, 1926 MRN: [id number]  PRIMARY CARE PROVIDER: Lauree Chandler, NP  RESPONSIBLE PARTY:  Acct ID - Guarantor Home Phone Work Phone Relationship Acct Type  [id number] - [phone]  Self P/F     [address], [place], [place] [address]   COMMUNITY PALLIATIVE CARE RN NOTE    PLAN OF CARE and INTERVENTION:  ADVANCE CARE PLANNING/GOALS OF CARE: Comfort, safety, dignity, MOST form- DNR, No further hospitalizations.  PATIENT/CAREGIVER EDUCATION: Safety, role of Palliative care DISEASE STATUS:  RN Palliative care visit completed today @ Memory care facility. Found patient in room, sitting in her chair. Awake. Patient did make some eye contact. Verbalizations difficult to understand. Patient slightly anxious with RN assessment but did allow. Patient has a baby doll in her room that she appears to enjoy and seemed to make her less anxious. No evidence of pain or distress. Spoke with facility caregiver, Delana Meyer, who reported that patient had another episode over the weekend where she passes out. Per DON and noted MOST form, goals of care include comfort with no further hospitalizations. Meal intake remains fair. Requires cueing and some assistance to complete meals. Current weight is 126.5 which is a 7.5-pound increase from July 2022. Review of documentation - fall on 04/16/21- next to her bed without injury.   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 Palliative care has been asked to follow for additional support, goals of care and complex decision making.  CODE STATUS: DNR PPS: 40%    PHYSICAL EXAM:   General: Frail body appearance, No acute distress. Pulmonary: LS diminished, no congestion, unlabored respirations Cardiac:  RRR, no edema, no evidence of chest pain. Neurological: weakness, progressive cognitive decline. [place]: Ambulatory with walker,  standby assistance. Skin: Warm, Dry, intact. Abdomen: abdomen soft, non- tender, BS positive.  Extremities: No edema noted.         Maxwell Caul RN, BSN      Cornelius Moras, RN

## 2021-06-05 ENCOUNTER — Encounter: Payer: Self-pay | Admitting: Nurse Practitioner

## 2021-06-06 ENCOUNTER — Other Ambulatory Visit: Payer: Self-pay

## 2021-06-06 ENCOUNTER — Non-Acute Institutional Stay: Payer: Medicare Other

## 2021-06-06 ENCOUNTER — Telehealth: Payer: Self-pay | Admitting: Nurse Practitioner

## 2021-06-06 DIAGNOSIS — Z515 Encounter for palliative care: Secondary | ICD-10-CM

## 2021-06-06 NOTE — Telephone Encounter (Signed)
Agree, thank you

## 2021-06-06 NOTE — Telephone Encounter (Signed)
Daughter called to make a video appointment for a Pt referral to home health. I let her know we could do this as a virtual visit but being that she has not seen [name] since July of 2021 and only say Cleophas Dunker in July of 2022 for an acute visit she would need to be seen before we sign anymore orders.

## 2021-06-07 ENCOUNTER — Telehealth (INDEPENDENT_AMBULATORY_CARE_PROVIDER_SITE_OTHER): Payer: Medicare Other | Admitting: Nurse Practitioner

## 2021-06-07 ENCOUNTER — Other Ambulatory Visit: Payer: Self-pay

## 2021-06-07 DIAGNOSIS — G301 Alzheimer's disease with late onset: Secondary | ICD-10-CM | POA: Diagnosis not present

## 2021-06-07 DIAGNOSIS — R5381 Other malaise: Secondary | ICD-10-CM | POA: Diagnosis not present

## 2021-06-07 DIAGNOSIS — F02818 Dementia in other diseases classified elsewhere, unspecified severity, with other behavioral disturbance: Secondary | ICD-10-CM | POA: Diagnosis not present

## 2021-06-07 NOTE — Progress Notes (Addendum)
Careteam: Patient Care Team: Lauree Chandler, NP as PCP - General (Geriatric Medicine)  Advanced Directive information    Allergies  Allergen Reactions   Penicillins     Chief Complaint  Patient presents with   Acute Visit    Patient is needing PT order with home health. Patient needs PT to transition her to wheelchair. Patient is weak and lethargic.     HPI: Patient is a 88 y.o. female for order for PT.   [name] her daughter requesting PT referral.  She has been using walker but having more difficulty with this. Facility requesting a wheel chair to transport her to the eating area.   Also needs a wheel chair to help get her into the medical building for appt. She is very limited with walker, she walks slow and at high risk for falls.   Daughter also looking into a seat lift as it is hard to get her up.   [facility] requesting PT order to get her a wheelchair.   She is in memory care at [facility] which provides care to help wash her, change her clothes, help her go to the bathroom. She is able to feed herself but needs help getting to the cafeteria   Review of Systems:  Review of Systems  Unable to perform ROS: Dementia   Past Medical History:  Diagnosis Date   Bradycardia    Dehydration    Dementia (HCC)    Unresponsive episode    No past surgical history on file. Social History:   reports that she has never smoked. She has never used smokeless tobacco. She reports that she does not drink alcohol and does not use drugs.  No family history on file.  Medications: Patient's Medications  New Prescriptions   No medications on file  Previous Medications   ACETAMINOPHEN (TYLENOL) 325 MG TABLET    Take 1 tablet (325 mg total) by mouth every 6 (six) hours as needed.   ALPRAZOLAM (XANAX) 0.25 MG TABLET    Take 1 tablet (0.25 mg total) by mouth daily as needed for anxiety.   DOCUSATE SODIUM 100 MG CAPSULE    Take 100 mg by mouth daily.   MELATONIN 5 MG CHEW     Chew 1 tablet by mouth at bedtime.   POLYETHYLENE GLYCOL (MIRALAX / GLYCOLAX) 17 G PACKET    Take 17 g by mouth daily.   QUETIAPINE (SEROQUEL) 50 MG TABLET    TAKE 1 TABLET(50 MG) BY MOUTH AT BEDTIME   SERTRALINE (ZOLOFT) 100 MG TABLET    Take Two tablets by mouth daily. Needs an appointment before anymore future refills.  Modified Medications   No medications on file  Discontinued Medications   UNABLE TO FIND    Hospice Care    Physical Exam:  There were no vitals filed for this visit. There is no height or weight on file to calculate BMI. Wt Readings from Last 3 Encounters:  05/23/21 126 lb 8 oz (57.4 kg)  12/07/20 118 lb 6.4 oz (53.7 kg)  04/27/20 113 lb 15.7 oz (51.7 kg)    Physical Exam Constitutional:      Appearance: Normal appearance.  Neurological:     Mental Status: She is alert. Mental status is at baseline.    Labs reviewed: Basic Metabolic Panel: Recent Labs    04/22/21 1615  NA 134*  K 4.1  CL 100  CO2 27  GLUCOSE 103*  BUN 13  CREATININE 1.12*  CALCIUM 9.1  Liver Function Tests: No results for input(s): AST, ALT, ALKPHOS, BILITOT, PROT, ALBUMIN in the last 8760 hours. No results for input(s): LIPASE, AMYLASE in the last 8760 hours. No results for input(s): AMMONIA in the last 8760 hours. CBC: Recent Labs    04/22/21 1615  WBC 5.3  NEUTROABS 3.4  HGB 9.4*  HCT 31.4*  MCV 94.0  PLT 215   Lipid Panel: No results for input(s): CHOL, HDL, LDLCALC, TRIG, CHOLHDL, LDLDIRECT in the last 8760 hours. TSH: No results for input(s): TSH in the last 8760 hours. A1C: No results found for: HGBA1C   Assessment/Plan Late onset Alzheimer's disease with behavioral disturbance (HCC) - Plan:advanced dementia, continues to progress. Expect ambulation to continue to decline, needs WC- Ambulatory referral to Physical Therapy  Debility -due to progression of dementia will get PT at [facility] to evaluate and help with ordering WC Plan: Ambulatory referral to  Physical Therapy,med  patent cannot use a Cane, Crutch or Walker to resolve issue and can either propel herself or has Caregivers that can propel her safely   Jobina Maita K. Harle Battiest  [facility] [phone]    Virtual Visit via Deloris Ping- face to face video  I connected with patient on 06/07/21 at  2:15 PM EST by video and verified that I am speaking with the correct person using two identifiers.  Location: Patient:home Provider: [facility]   I discussed the limitations, risks, security and privacy concerns of performing an evaluation and management service by telephone and the availability of in person appointments. I also discussed with the patient that there may be a patient responsible charge related to this service. The patient expressed understanding and agreed to proceed.   I discussed the assessment and treatment plan with the patient. The patient was provided an opportunity to ask questions and all were answered. The patient agreed with the plan and demonstrated an understanding of the instructions.   The patient was advised to call back or seek an in-person evaluation if the symptoms worsen or if the condition fails to improve as anticipated.  I provided 15 minutes of non-face-to-face time during this encounter.  Carlos American. Harle Battiest Avs printed and mailed

## 2021-06-07 NOTE — Progress Notes (Signed)
This service is provided via telemedicine  No vital signs collected/recorded due to the encounter was a telemedicine visit.   Location of patient (ex: home, work):  Home  Patient consents to a telephone visit:  Yes, see encounter dated 04/24/2021  Location of the provider (ex: office, home):  [place]  Name of any referring provider:  N/A  Names of all persons participating in the telemedicine service and their role in the encounter:  Sherrie Mustache, Nurse Practitioner, Carroll Kinds, CMA, patient and daughter [name].  Time spent on call:  13 minutes with medical assistant

## 2021-06-07 NOTE — Progress Notes (Signed)
Received message to call patient's daughter, [name]. Spoke with [name] who expressed concern over changes observed in patient. Per [name], patient was very lethargic and drowsy on 06/05/21. Patient was not able to keep her eyes open. Patient was a little better today. Patient is no longer ambulatory and now requires a w/c for transport. [name] shared that patient is scheduled for a virtual visit with PCP on 06/07/21. [name] requested a palliative care visit over the next few days. Dr. Mariea Clonts updated. Plan is for RN visit to follow up.       Phone call placed to [facility], RN/DON at memory care. Message left with return call information.

## 2021-06-08 ENCOUNTER — Other Ambulatory Visit: Payer: Self-pay

## 2021-06-08 ENCOUNTER — Non-Acute Institutional Stay: Payer: Medicare Other

## 2021-06-08 ENCOUNTER — Telehealth: Payer: Self-pay

## 2021-06-08 DIAGNOSIS — Z515 Encounter for palliative care: Secondary | ICD-10-CM

## 2021-06-08 NOTE — Progress Notes (Signed)
COMMUNITY PALLIATIVE CARE RN NOTE    PLAN OF CARE and INTERVENTION:  ADVANCE CARE PLANNING/GOALS OF CARE: Comfort, safety PATIENT/CAREGIVER EDUCATION: Palliative care, Safety DISEASE STATUS:  RN Palliative care visit completed today at [facility] care. Spoke with Chewey, med tech, who shared that patient's gait has been unsteady, increasing weakness. Patient is now using her wheelchair most of the time. She can ambulate with walker and stand by assist but only small distances. Per Bagley, patient had a fall over this past weekend without injury noted. Located patient in her room sitting in her chair by her bed. Volunteer present singing songs to her. Patient would sporadically hum with the singing. Patient appears much more fatigued and drowsier than she did at this RN previous visit. Observed patient closing eyes and allowing head to fall frequently throughout the visit. Patient was able to squeeze this RN's hands and raise both of her legs when asked to.  HISTORY OF PRESENT ILLNESS: This is a 88 year old female with a past medical history significant for late onset of Dementia, debility, and constipation. Palliative care has been asked to follow for additional support, care and complex decision making.  CODE STATUS: DNR PPS: 30% BP:140/80 right arm/sitting HR: 68 R:16 SPO2: 95% on room air Weight: 126. 8 (on 05/23/22); 129 (05/31/21) Height: 5'4.5   PHYSICAL EXAM:  General: NAD, sleepy Pulmonary: Respirations unlabored, LS CTA, patient not able to follow directions for deep breathing. Cardiac: RRR, no edema, no evidence of chest pain Abdomen: soft, no evidence of tenderness Skin: no visible lesions, wounds Neurological: increasing weakness, progressive cognitive impairment.    Duration: 60 min       Dietrich Pates, BSN

## 2021-06-08 NOTE — Telephone Encounter (Signed)
Message left for daughter, [name], to provide update from today's visit.

## 2021-06-12 DIAGNOSIS — E559 Vitamin D deficiency, unspecified: Secondary | ICD-10-CM

## 2021-06-12 DIAGNOSIS — G301 Alzheimer's disease with late onset: Secondary | ICD-10-CM | POA: Diagnosis not present

## 2021-06-12 DIAGNOSIS — J301 Allergic rhinitis due to pollen: Secondary | ICD-10-CM

## 2021-06-12 DIAGNOSIS — K5900 Constipation, unspecified: Secondary | ICD-10-CM | POA: Diagnosis not present

## 2021-06-12 DIAGNOSIS — D649 Anemia, unspecified: Secondary | ICD-10-CM | POA: Diagnosis not present

## 2021-06-12 DIAGNOSIS — I10 Essential (primary) hypertension: Secondary | ICD-10-CM

## 2021-06-12 DIAGNOSIS — F0284 Dementia in other diseases classified elsewhere, unspecified severity, with anxiety: Secondary | ICD-10-CM | POA: Diagnosis not present

## 2021-06-12 DIAGNOSIS — E7800 Pure hypercholesterolemia, unspecified: Secondary | ICD-10-CM

## 2021-06-14 ENCOUNTER — Telehealth: Payer: Self-pay

## 2021-06-14 NOTE — Telephone Encounter (Signed)
Received message to call patient's daughter. Spoke with daughter,Dale. Discussed this RN's last visit with patient. Shared with daughter that patient did appear more fatigued but other aspects of assessment were at baseline for patient. Daughter updated that patient had a volunteer present who was singing to her and patient would hum to the songs. Plan is to see patient again in 1-2 weeks and follow up with daughter.

## 2021-06-18 ENCOUNTER — Telehealth: Payer: Self-pay | Admitting: *Deleted

## 2021-06-18 NOTE — Telephone Encounter (Signed)
[name] with [facility]) called requesting verbal orders for Terri Green, 2X3weeks and 1X3weeks.   Verbal orders given.

## 2021-06-25 ENCOUNTER — Non-Acute Institutional Stay: Payer: Medicare Other

## 2021-06-25 ENCOUNTER — Other Ambulatory Visit: Payer: Self-pay

## 2021-06-25 DIAGNOSIS — Z515 Encounter for palliative care: Secondary | ICD-10-CM

## 2021-06-25 NOTE — Progress Notes (Signed)
COMMUNITY PALLIATIVE CARE SW NOTE  PATIENT NAME: Terri Green DOB: [birth year] MRN: [id number]  PRIMARY CARE PROVIDER: Lauree Chandler, NP  RESPONSIBLE PARTY:  Acct ID - Guarantor Home Phone Work Phone Relationship Acct Type  [id number] - [place],[phone]  Self P/F     [address], [place], [place] [address]     PLAN OF CARE and INTERVENTIONS:             GOALS OF CARE/ ADVANCE CARE PLANNING:  Goal is for patient to remain in the memory care facility. Patient is a DNR. SOCIAL/EMOTIONAL/SPIRITUAL ASSESSMENT/ INTERVENTIONS:  SW and RN-B. Senor (virtually) visit with patient at [facility]. Patient's daughter-Dale was present with her. Patient was sitting and watching TV when SW initially arrived, but her later her daughter had her working with toys she had brought in. Patient was responsive to simple yes/no questions but other verbalizations were nonsensical. Patient denied pain. Patient did not recognize her daughter, which her daughter report is normal in her disease process. SW sat with patient and daughter who provided some social history on patient and how she manages patient. Patient's daughter report that she is coping well and although she was apprehensive about placing patient, she feels that it was a good choice. The staff was med tech-[name] was consulted and she verbalized no concerns or changes in patient's condition. Patient's appetite remains good. She continues to utilize her walker to ambulate and she has not had any episodes where she passes out. SW provided assessment of patient needs and comfort, coping and needs of PCG, social stimulation, supportive presence and reinforced access to support. Patient's daughter remain open to ongoing palliative care visits and support.  PATIENT/CAREGIVER EDUCATION/ COPING:  Patient and daughter appear to be coping well. The daughter is confident in the care that patient is receiving.  PERSONAL EMERGENCY PLAN:  Per facility protocol. COMMUNITY RESOURCES COORDINATION/ HEALTH CARE NAVIGATION:  Patient will have six sessions of PT for strengthening and safety. FINANCIAL/LEGAL CONCERNS/INTERVENTIONS:  None.     SOCIAL HX:  Social History   Tobacco Use   Smoking status: Never   Smokeless tobacco: Never  Substance Use Topics   Alcohol use: Never    CODE STATUS: DNR ADVANCED DIRECTIVES: Yes MOST FORM COMPLETE: Yes HOSPICE EDUCATION PROVIDED: No  PPS: Patient is alert and oriented to self only. She ambulates with her walker. Her appetite remains good.   Duration of visit and documentation: 60 minutes.   [address] [place], [place]

## 2021-06-28 NOTE — Progress Notes (Signed)
PATIENT NAME: Terri Green DOB: 1927-04-30 MRN: [id number]  PRIMARY CARE PROVIDER: Lauree Chandler, NP  RESPONSIBLE PARTY:  Acct ID - Guarantor Home Phone Work Phone Relationship Acct Type  [id number] - Terri Green,[phone]  Self P/F     [address], [place], [place] [address]    PLAN OF CARE and INTERVENTION:  ADVANCE CARE PLANNING/GOALS OF CARE: Safety, comfort, remain in her home. MOST form. PATIENT/CAREGIVER EDUCATION: Palliative care DISEASE STATUS: Virtually connected with Terri Green-Palliative care SW who was present at facility. Patient's daughter, Terri Green, present at facility. Patient is awake, observed playing with fidgeting toys. More awake during this visit then previous visit. Terri Green, med tech, shared patient is at her baseline. Meal intake is good, Current weight is 129 pounds. No recent falls or unresponsive episodes. Patient is ambulating with assistance and use of walker. She will also at times use her wheelchair. No concerns expressed at this time from facility caregivers nor daughter, Terri Green. HISTORY OF PRESENT ILLNESS: This is a 88 year old female with diagnosis of Dementia, Vitamin D deficiency and constipation. Palliative care has been asked to follow for additional support, care and complex decision making.   CODE STATUS: DNR, MOST form PPS: 40%     PHYSICAL EXAM: deferred   Terri Moras, RN

## 2021-07-03 ENCOUNTER — Telehealth: Payer: Self-pay | Admitting: *Deleted

## 2021-07-03 NOTE — Telephone Encounter (Signed)
Kelli with [facility] [phone] called to confirm we received fax. Confirmed Would like the Wheelchair order faxed to [facility] Fax: [phone]  Also would like a copy faxed to them to have on file Fax: [phone]

## 2021-07-03 NOTE — Telephone Encounter (Signed)
noted 

## 2021-07-03 NOTE — Telephone Encounter (Signed)
Received fax from [facility] for Wheelchair Assessment.  Stated Patient would Benefit from a Standard Light Weight Wheelchair with Removable Arm Rests and Leg Rests to help Caregivers to assist with Transfers especially while going to the doctor's visits.   Please Advise. (Fax placed in [facility] folder to review)    OV Note Dated 06/07/2021: HPI: Patient is a 88 y.o. female for order for PT.    [name] her daughter requesting PT referral.  She has been using walker but having more difficulty with this. Facility requesting a wheel chair to transport her to the eating area.    Also needs a wheel chair to help get her into the medical building for appt. She is very limited with walker, she walks slow and at high risk for falls.    Daughter also looking into a seat lift as it is hard to get her up.    [facility] requesting PT order to get her a wheelchair.

## 2021-07-05 ENCOUNTER — Encounter: Payer: Self-pay | Admitting: Nurse Practitioner

## 2021-07-05 DIAGNOSIS — F02818 Dementia in other diseases classified elsewhere, unspecified severity, with other behavioral disturbance: Secondary | ICD-10-CM

## 2021-07-05 DIAGNOSIS — R5381 Other malaise: Secondary | ICD-10-CM

## 2021-07-05 DIAGNOSIS — G301 Alzheimer's disease with late onset: Secondary | ICD-10-CM

## 2021-07-05 NOTE — Telephone Encounter (Signed)
Claiborne Billings with [facility] called requesting update on Order for patient's wheelchair. Requesting it to be faxed to [facility]. Daughter is requesting due to needing to transport patient to Dr. Visits.

## 2021-07-06 NOTE — Telephone Encounter (Signed)
Pended Order and sent to [facility] for approval.  To be faxed to [facility] once signed.

## 2021-07-06 NOTE — Telephone Encounter (Signed)
Orders signed and faxed to [facility].

## 2021-07-10 ENCOUNTER — Ambulatory Visit: Payer: Medicare Other | Admitting: Nurse Practitioner

## 2021-07-10 NOTE — Telephone Encounter (Signed)
Correction to Previous note: Dusty With [facility] (not [facility])  Addendum faxed to Fax: [phone]

## 2021-07-10 NOTE — Telephone Encounter (Signed)
Addendum has been made. Thank you

## 2021-07-10 NOTE — Telephone Encounter (Signed)
Terri Green with [facility] [phone] called and stated that in order for Medicare to cover Wheelchair an addendum needs to be added to [facility] notes stating that patent cannot use a Cane, Crutch or Walker to resolve issue and she can either propel herself or has Caregivers that can propel her safely.   Please Advise.

## 2021-07-12 ENCOUNTER — Telehealth: Payer: Self-pay

## 2021-07-12 NOTE — Telephone Encounter (Signed)
Kelly with [facility] called to question status of wheelchair order that was to be sent to [facility]. Claiborne Billings states [facility] has yet to receive order.   Refer to mychart message dated 07/05/21 that reflects the documentation of Rodena Piety (CMA) sending DME order to [facility]. Claiborne Billings was informed that I will re-fax order to [phone]. Claiborne Billings was unable to confirm the fax number for [facility], however stated she will call back if incorrect  Order was re-faxed

## 2021-07-19 ENCOUNTER — Telehealth: Payer: Self-pay | Admitting: *Deleted

## 2021-07-19 NOTE — Telephone Encounter (Signed)
Leanne with [facility] called and stated that patient's Daughter called them and requested for them not to visit patient anymore.  They are Discharging patient today per daughter's request.   FYI

## 2021-07-23 ENCOUNTER — Ambulatory Visit (INDEPENDENT_AMBULATORY_CARE_PROVIDER_SITE_OTHER): Payer: Medicare Other | Admitting: Nurse Practitioner

## 2021-07-23 ENCOUNTER — Other Ambulatory Visit: Payer: Self-pay

## 2021-07-23 ENCOUNTER — Encounter: Payer: Self-pay | Admitting: Nurse Practitioner

## 2021-07-23 VITALS — BP 132/80 | HR 75 | Temp 97.3°F | Ht 64.5 in | Wt 130.0 lb

## 2021-07-23 DIAGNOSIS — G301 Alzheimer's disease with late onset: Secondary | ICD-10-CM | POA: Diagnosis not present

## 2021-07-23 DIAGNOSIS — K5901 Slow transit constipation: Secondary | ICD-10-CM | POA: Diagnosis not present

## 2021-07-23 DIAGNOSIS — F419 Anxiety disorder, unspecified: Secondary | ICD-10-CM | POA: Diagnosis not present

## 2021-07-23 DIAGNOSIS — R5381 Other malaise: Secondary | ICD-10-CM

## 2021-07-23 DIAGNOSIS — D649 Anemia, unspecified: Secondary | ICD-10-CM

## 2021-07-23 DIAGNOSIS — F02818 Dementia in other diseases classified elsewhere, unspecified severity, with other behavioral disturbance: Secondary | ICD-10-CM

## 2021-07-23 NOTE — Progress Notes (Signed)
Careteam: Patient Care Team: Lauree Chandler, NP as PCP - General (Geriatric Medicine)  PLACE OF SERVICE:  [facility] Directive information Does Patient Have a Medical Advance Directive?: Yes, Type of Advance Directive: [place];Living will;Out of facility DNR (pink MOST or yellow form), Pre-existing out of facility DNR order (yellow form or pink MOST form): Yellow form placed in chart (order not valid for inpatient use);Pink MOST form placed in chart (order not valid for inpatient use), Does patient want to make changes to medical advance directive?: No - Patient declined  Allergies  Allergen Reactions   Penicillins     Chief Complaint  Patient presents with   Medical Management of Chronic Issues    Extended visit. Discuss need for td/tdap and shingrix or post pone if patient refuses. Patient denies receiving any vaccines since last visit. Here with daughter [name]. Doing PT at [facility]       HPI: Patient is a 88 y.o. female for routine follow up.  PT wants to continue to work with her to help get her up and moving around. She is very resistant with getting up. She does not walk a lot.  Daughter encouraging her to move and not sit in the chair all day.   She is sleeping well at night.  She does have trouble with bathing- resist care a lot of the time No skin issues  Review of Systems:  Review of Systems  Unable to perform ROS: Dementia   Past Medical History:  Diagnosis Date   Bradycardia    Dehydration    Dementia ([place])    Unresponsive episode    History reviewed. No pertinent surgical history. Social History:   reports that she has never smoked. She has never used smokeless tobacco. She reports that she does not drink alcohol and does not use drugs.  History reviewed. No pertinent family history.  Medications: Patient's Medications  New Prescriptions   No medications on file  Previous Medications   ACETAMINOPHEN (TYLENOL)  325 MG TABLET    Take 1 tablet (325 mg total) by mouth every 6 (six) hours as needed.   ALPRAZOLAM (XANAX) 0.25 MG TABLET    Take 1 tablet (0.25 mg total) by mouth daily as needed for anxiety.   MELATONIN 5 MG CHEW    Chew 1 tablet by mouth at bedtime.   POLYETHYLENE GLYCOL (MIRALAX / GLYCOLAX) 17 G PACKET    Take 17 g by mouth daily.   QUETIAPINE (SEROQUEL) 50 MG TABLET    TAKE 1 TABLET(50 MG) BY MOUTH AT BEDTIME   SENNOSIDES-DOCUSATE SODIUM (SENOKOT-S) 8.6-50 MG TABLET    Take 1 tablet by mouth daily.   SERTRALINE (ZOLOFT) 100 MG TABLET    Take Two tablets by mouth daily. Needs an appointment before anymore future refills.  Modified Medications   No medications on file  Discontinued Medications   DOCUSATE SODIUM 100 MG CAPSULE    Take 100 mg by mouth daily.    Physical Exam:  Vitals:   07/23/21 1525  BP: 132/80  Pulse: 75  Temp: (!) 97.3 F (36.3 C)  TempSrc: Temporal  SpO2: 99%  Weight: 130 lb (59 kg)  Height: 5' 4.5" (1.638 m)   Body mass index is 21.97 kg/m. Wt Readings from Last 3 Encounters:  07/23/21 130 lb (59 kg)  05/23/21 126 lb 8 oz (57.4 kg)  12/07/20 118 lb 6.4 oz (53.7 kg)    Physical Exam Constitutional:  General: She is not in acute distress.    Appearance: She is well-developed. She is not diaphoretic.  HENT:     Head: Normocephalic and atraumatic.     Mouth/Throat:     Pharynx: No oropharyngeal exudate.  Eyes:     Conjunctiva/sclera: Conjunctivae normal.     Pupils: Pupils are equal, round, and reactive to light.  Cardiovascular:     Rate and Rhythm: Normal rate and regular rhythm.     Heart sounds: Normal heart sounds.  Pulmonary:     Effort: Pulmonary effort is normal.     Breath sounds: Normal breath sounds.  Abdominal:     General: Bowel sounds are normal.     Palpations: Abdomen is soft.  Musculoskeletal:     Cervical back: Normal range of motion and neck supple.     Right lower leg: No edema.     Left lower leg: No edema.  Skin:     General: Skin is warm and dry.  Neurological:     Mental Status: She is alert.     Gait: Gait abnormal (shuffling gait, uses walker).  Psychiatric:        Mood and Affect: Mood normal.    Labs reviewed: Basic Metabolic Panel: Recent Labs    04/22/21 1615  NA 134*  K 4.1  CL 100  CO2 27  GLUCOSE 103*  BUN 13  CREATININE 1.12*  CALCIUM 9.1   Liver Function Tests: No results for input(s): AST, ALT, ALKPHOS, BILITOT, PROT, ALBUMIN in the last 8760 hours. No results for input(s): LIPASE, AMYLASE in the last 8760 hours. No results for input(s): AMMONIA in the last 8760 hours. CBC: Recent Labs    04/22/21 1615  WBC 5.3  NEUTROABS 3.4  HGB 9.4*  HCT 31.4*  MCV 94.0  PLT 215   Lipid Panel: No results for input(s): CHOL, HDL, LDLCALC, TRIG, CHOLHDL, LDLDIRECT in the last 8760 hours. TSH: No results for input(s): TSH in the last 8760 hours. A1C: No results found for: HGBA1C   Assessment/Plan 1. Late onset Alzheimer's disease with behavioral disturbance (HCC) -Stable, no acute changes in cognitive or functional status, continue supportive care.  -behaviors controlled on seroquel  2. Debility -continues with PT  3. Anxiety Controlled on zoloft 100 mg daily - CMP with eGFR([facility])  4. Slow transit constipation Stable on miralax   5. Anemia, unspecified type - CBC with Differential/Platelet  6. Insomnia Stable on melatonin qhs   Terri Green K. [place], [facility] [phone]

## 2021-07-24 ENCOUNTER — Telehealth: Payer: Self-pay | Admitting: *Deleted

## 2021-07-24 LAB — CBC WITH DIFFERENTIAL/PLATELET
Absolute Monocytes: 612 cells/uL (ref 200–950)
Basophils Absolute: 31 cells/uL (ref 0–200)
Basophils Relative: 0.7 %
Eosinophils Absolute: 163 cells/uL (ref 15–500)
Eosinophils Relative: 3.7 %
HCT: 33.1 % — ABNORMAL LOW (ref 35.0–45.0)
Hemoglobin: 10.8 g/dL — ABNORMAL LOW (ref 11.7–15.5)
Lymphs Abs: 1245 cells/uL (ref 850–3900)
MCH: 28.7 pg (ref 27.0–33.0)
MCHC: 32.6 g/dL (ref 32.0–36.0)
MCV: 88 fL (ref 80.0–100.0)
MPV: 9.3 fL (ref 7.5–12.5)
Monocytes Relative: 13.9 %
Neutro Abs: 2350 cells/uL (ref 1500–7800)
Neutrophils Relative %: 53.4 %
Platelets: 274 10*3/uL (ref 140–400)
RBC: 3.76 10*6/uL — ABNORMAL LOW (ref 3.80–5.10)
RDW: 13.7 % (ref 11.0–15.0)
Total Lymphocyte: 28.3 %
WBC: 4.4 10*3/uL (ref 3.8–10.8)

## 2021-07-24 LAB — COMPLETE METABOLIC PANEL WITH GFR
AG Ratio: 1.3 (calc) (ref 1.0–2.5)
ALT: 13 U/L (ref 6–29)
AST: 22 U/L (ref 10–35)
Albumin: 4.2 g/dL (ref 3.6–5.1)
Alkaline phosphatase (APISO): 71 U/L (ref 37–153)
BUN/Creatinine Ratio: 19 (calc) (ref 6–22)
BUN: 18 mg/dL (ref 7–25)
CO2: 24 mmol/L (ref 20–32)
Calcium: 9.4 mg/dL (ref 8.6–10.4)
Chloride: 101 mmol/L (ref 98–110)
Creat: 0.97 mg/dL — ABNORMAL HIGH (ref 0.60–0.95)
Globulin: 3.3 g/dL (calc) (ref 1.9–3.7)
Glucose, Bld: 82 mg/dL (ref 65–139)
Potassium: 4.7 mmol/L (ref 3.5–5.3)
Sodium: 138 mmol/L (ref 135–146)
Total Bilirubin: 0.3 mg/dL (ref 0.2–1.2)
Total Protein: 7.5 g/dL (ref 6.1–8.1)
eGFR: 55 mL/min/{1.73_m2} — ABNORMAL LOW (ref >=60)

## 2021-07-24 NOTE — Telephone Encounter (Signed)
Verbal orders given  

## 2021-07-24 NOTE — Telephone Encounter (Signed)
Trish with [facility] called and stated that daughter called them requesting for PT to resume. Daughter had called regarding Discharge but now wants to resume .   Nurse is requesting extension of PT 2x4weeks.   LMOM to return call to given Verbal orders.

## 2021-09-05 ENCOUNTER — Non-Acute Institutional Stay: Payer: Medicare Other | Admitting: Internal Medicine

## 2021-09-05 VITALS — BP 98/63 | HR 53 | Temp 96.7°F | Resp 18 | Wt 127.5 lb

## 2021-09-05 DIAGNOSIS — S80811A Abrasion, right lower leg, initial encounter: Secondary | ICD-10-CM

## 2021-09-05 DIAGNOSIS — F02C2 Dementia in other diseases classified elsewhere, severe, with psychotic disturbance: Secondary | ICD-10-CM

## 2021-09-05 DIAGNOSIS — Z515 Encounter for palliative care: Secondary | ICD-10-CM

## 2021-09-05 DIAGNOSIS — W19XXXA Unspecified fall, initial encounter: Secondary | ICD-10-CM

## 2021-09-05 DIAGNOSIS — G301 Alzheimer's disease with late onset: Secondary | ICD-10-CM

## 2021-09-05 NOTE — Progress Notes (Signed)
[facility] Pall[facility] Care Follow-Up Visit Telephone: [phone]  Fax: [phone]   Date of encounter: 09/05/21 10:57 AM PATIENT NAME: Terri Green [address] [place] [address]   [phone] (home)  DOB: November 23, 1926 MRN: [id number] PRIMARY CARE PROVIDER:    Lauree Chandler, NP,  [place] [address] [phone]  REFERRING PROVIDER:   Lauree Chandler, NP [place],  [place] [address] [phone]  RESPONSIBLE PARTY:    Contact Information     Name Relation Home Work [place] Daughter [phone]  [phone]        I met face to face with patient and family in [facility]. Palliative Care was asked to follow this patient by consultation request of  Lauree Chandler, NP to address advance care planning and complex medical decision making. This is follow-up visit.                                     ASSESSMENT AND PLAN / RECOMMENDATIONS:   Advance Care Planning/Goals of Care: Goals include to maximize quality of life and symptom management. Patient/health care surrogate gave his/her permission to discuss.Our advance care planning conversation included a discussion about:    The value and importance of advance care planning  Experiences with loved ones who have been seriously ill or have died  Exploration of personal, cultural or spiritual beliefs that might influence medical decisions  Exploration of goals of care in the event of a sudden injury or illness  Identification  of a healthcare agent  Review and updating or creation of an  advance directive document . Decision not to resuscitate or to de-escalate disease focused treatments due to poor prognosis. CODE STATUS:  DNR; MOST reviewed and new one completed as facility did not have a copy any longer--DNR, comfort measures, abx to be determined when infection occurs, IVF trial only if  patient awake and alert at the time; no feeding tubes as reviewed with daughter, [name], who also had spoken with her brother about this when decisions made; 20 mins spent on ACP  Symptom Management/Plan: 1. Severe late onset Alzheimer's dementia with psychotic disturbance ([place]) -continues gradual progression -pt dependent in adls--requires CNA to walk behind her with her using her walker certainly since this last fall -is able to feed herself slowly  -needs help with all other adls including toileting -is incontinent at times  -minimally verbal -in FAST upper 6s to lower 7 range  2. Fall, initial encounter -had struck her head with small hematoma on occiput and had shortening and rotation of left leg concerning for hip fx but appears she actually struck her right leg and is now walking with it stiffened since the fall -has prn tylenol if showing signs of pain but these don't seem to be consistent or reliable  3. Abrasion of right lower extremity, initial encounter -due to fall--nearly healed as of today on shin  4. Palliative care encounter -met with her daughter, [name], and we reviewed her current state and clarified her MOST form which had to be redone as it must have been kept at the hospital or by EMS -original given to facility and another to [facility] plus uploaded to vynca    Follow up Palliative Care Visit: Palliative care will continue to follow for complex medical decision making, advance care planning, and  clarification of goals. Return 12 weeks or prn.  This visit was coded based on medical decision making (MDM). 20 mins spent on acp  PPS: 40%  HOSPICE ELIGIBILITY/DIAGNOSIS: not yet/Alzheimer's disease    Chief Complaint: Follow-up palliative visit  HISTORY OF PRESENT ILLNESS:  Terri Green is a 88 y.o. year old female  with AD with behaviors, advanced stage, depression, constipation, insomnia and anxiety seen for palliative care follow-up after recent fall on 09/01/21 that  took her to the ED after she struck her head and there were concerns that she'd fractured her hip.  It's thought that she fell out of the bed.  She will say she has pain, but usually points to her abdomen, but CNA says this is what she does when she needs to use the bathroom (which she did amid the visit).     Weight has been stable since 12/22.    History obtained from review of EMR, discussion with primary team, and interview with family, facility staff/caregiver and/or Ms. Villena.  I reviewed available labs, medications, imaging, studies and related documents from the EMR.  Records reviewed and summarized above.   ROS  General: NAD EYES: denies vision changes ENMT: has dysphagia--on pureed diet with thin liquids  Cardiovascular: denies chest pain, denies DOE Pulmonary: denies cough, denies increased SOB Abdomen: endorses good appetite, denies constipation, endorses some incontinence of bowel but usually does indicate when she needs to use bathroom and CNAs take her GU: denies dysuria, endorses incontinence of urine MSK:  has increased weakness,  one fall reported as above Skin: denies rashes or wounds--abrasion on shin scabbed over Neurological: reports pain but inconsistent, denies insomnia Psych: Endorses positive mood Heme/lymph/immuno: denies bruises, abnormal bleeding  Physical Exam:  Today's Vitals   09/05/21 0810  BP: 98/63  Pulse: (!) 53  Resp: 18  Temp: (!) 96.7 F (35.9 C)  SpO2: 97%  Weight: 127 lb 8 oz (57.8 kg)   Body mass index is 21.55 kg/m.  Current and past weights: last weight in chart 130 and prior 128 lbs Constitutional: NAD General: frail appearing, thin EYES: anicteric sclera, lids intact, no discharge  ENMT: intact hearing, oral mucous membranes moist CV: S1S2, RRR, no LE edema Pulmonary: LCTA, no increased work of breathing, no cough, room air Abdomen: intake 75%, normo-active BS + 4 quadrants, soft and non tender, no ascites GU:  deferred MSK: sarcopenia, stiffness of right side vs left, moves all extremities, ambulatory with walker and CNA behind her holding her upright and helping propel her forward Skin: warm and dry, no rashes or wounds on visible skin--scab on right shin only Neuro:  generalized weakness,  advanced cognitive impairment Psych: non-anxious affect, alert, not oriented, minimally verbal Hem/lymph/immuno: no widespread bruising  CURRENT PROBLEM LIST:  Patient Active Problem List   Diagnosis Date Noted   Constipation 05/24/2019   Anemia 05/06/2018   Vitamin D deficiency 07/18/2016   Anxiety 06/21/2015   Allergic rhinitis due to pollen 10/19/2014   Dementia ([place]) with behaviors  09/07/2014   Poor appetite 09/07/2014   Pure hypercholesterolemia, unspecified 06/01/2014   Benign essential hypertension 06/01/2014   PAST MEDICAL HISTORY:  Active Ambulatory Problems    Diagnosis Date Noted   Constipation 05/24/2019   Anemia 05/06/2018   Anxiety 06/21/2015   Dementia ([place]) with behaviors  09/07/2014   Allergic rhinitis due to pollen 10/19/2014   Vitamin D deficiency 07/18/2016   Pure hypercholesterolemia, unspecified 06/01/2014   Benign essential hypertension 06/01/2014   Poor appetite  09/07/2014   Resolved Ambulatory Problems    Diagnosis Date Noted   Fecal impaction ([place]) 05/24/2019   Past Medical History:  Diagnosis Date   Bradycardia    Dehydration    Unresponsive episode    SOCIAL HX:  Social History   Tobacco Use   Smoking status: Never   Smokeless tobacco: Never  Substance Use Topics   Alcohol use: Never     ALLERGIES:  Allergies  Allergen Reactions   Penicillins      PERTINENT MEDICATIONS:  Outpatient Encounter Medications as of 09/05/2021  Medication Sig   acetaminophen (TYLENOL) 325 MG tablet Take 1 tablet (325 mg total) by mouth every 6 (six) hours as needed.   ALPRAZolam (XANAX) 0.25 MG tablet Take 1 tablet (0.25 mg total) by mouth daily as needed for anxiety.    Melatonin 5 MG CHEW Chew 1 tablet by mouth at bedtime.   polyethylene glycol (MIRALAX / GLYCOLAX) 17 g packet Take 17 g by mouth daily.   QUEtiapine (SEROQUEL) 50 MG tablet TAKE 1 TABLET(50 MG) BY MOUTH AT BEDTIME   sennosides-docusate sodium (SENOKOT-S) 8.6-50 MG tablet Take 1 tablet by mouth daily.   sertraline (ZOLOFT) 100 MG tablet Take Two tablets by mouth daily. Needs an appointment before anymore future refills.   No facility-administered encounter medications on file as of 09/05/2021.   Thank you for the opportunity to participate in the care of Ms. Maurin.  The palliative care team will continue to follow. Please call our office at [phone] if we can be of additional assistance.   Hollace Kinnier, DO  COVID-19 PATIENT SCREENING TOOL Asked and negative response unless otherwise noted:  Have you had symptoms of covid, tested positive or been in contact with someone with symptoms/positive test in the past 5-10 days? no

## 2021-09-07 ENCOUNTER — Encounter: Payer: Self-pay | Admitting: Internal Medicine

## 2021-09-11 DIAGNOSIS — D649 Anemia, unspecified: Secondary | ICD-10-CM | POA: Diagnosis not present

## 2021-09-11 DIAGNOSIS — K5900 Constipation, unspecified: Secondary | ICD-10-CM | POA: Diagnosis not present

## 2021-09-11 DIAGNOSIS — G301 Alzheimer's disease with late onset: Secondary | ICD-10-CM | POA: Diagnosis not present

## 2021-09-11 DIAGNOSIS — E7800 Pure hypercholesterolemia, unspecified: Secondary | ICD-10-CM

## 2021-09-11 DIAGNOSIS — F0284 Dementia in other diseases classified elsewhere, unspecified severity, with anxiety: Secondary | ICD-10-CM | POA: Diagnosis not present

## 2021-09-11 DIAGNOSIS — I10 Essential (primary) hypertension: Secondary | ICD-10-CM

## 2021-09-11 DIAGNOSIS — J301 Allergic rhinitis due to pollen: Secondary | ICD-10-CM

## 2021-09-11 DIAGNOSIS — E559 Vitamin D deficiency, unspecified: Secondary | ICD-10-CM

## 2021-09-17 ENCOUNTER — Telehealth: Payer: Self-pay | Admitting: *Deleted

## 2021-09-17 NOTE — Telephone Encounter (Signed)
Terri Green with [facility] called requesting verbal orders to extend PT 2x3weeks, 1x1week.  ? ?Verbal order given.  ?

## 2021-10-21 ENCOUNTER — Other Ambulatory Visit: Payer: Self-pay | Admitting: Nurse Practitioner

## 2021-10-21 DIAGNOSIS — F419 Anxiety disorder, unspecified: Secondary | ICD-10-CM

## 2021-10-22 ENCOUNTER — Encounter: Payer: Self-pay | Admitting: Nurse Practitioner

## 2021-10-22 DIAGNOSIS — F419 Anxiety disorder, unspecified: Secondary | ICD-10-CM

## 2021-10-22 DIAGNOSIS — G301 Alzheimer's disease with late onset: Secondary | ICD-10-CM

## 2021-10-23 MED ORDER — SERTRALINE HCL 100 MG PO TABS: ORAL_TABLET | ORAL | 1 refills | Status: DC

## 2021-10-23 MED ORDER — QUETIAPINE FUMARATE 50 MG PO TABS: ORAL_TABLET | ORAL | 1 refills | Status: DC

## 2021-10-23 NOTE — Telephone Encounter (Signed)
Patient requested refills.

## 2021-11-09 ENCOUNTER — Encounter: Payer: Self-pay | Admitting: Nurse Practitioner

## 2021-11-13 ENCOUNTER — Encounter: Payer: Self-pay | Admitting: Nurse Practitioner

## 2021-11-22 ENCOUNTER — Telehealth: Payer: Self-pay

## 2021-11-22 NOTE — Telephone Encounter (Signed)
Further follow though documented in telephone encounter dated 11/22/21

## 2021-11-22 NOTE — Telephone Encounter (Signed)
To send order for guaifenesin DM 100 mg/5 ml to give 15 ML TID for 7 days then every 8 hours as needed cough and congestion to facility. Thank you

## 2021-11-22 NOTE — Telephone Encounter (Signed)
Patients PCP: Lauree Chandler, NP Called the office as she was working at a remote location and gave a verbal order for me to write rx for   Guaifenesin 100 mg per 5 mL 15 mL's by mouth three times daily x 1 week Then, every 8 hours as needed for cough and congestion    The above order was written on an order sheet and faxed to [phone] with demographics sheet at 2:05 pm.   Medication was also added to patients current medication list.   Side Note: I will hold order until Wednesday 11/28/21 at my desk to assure that it goes through and does not need to be re-submitted for any reason. After 11/28/21 order will be sent to scanning for documentation purposes.  Message will be forwarded to Lauree Chandler, NP to document anything additional if necessary, otherwise encounter can be signed.

## 2022-01-15 ENCOUNTER — Non-Acute Institutional Stay: Payer: Medicare Other

## 2022-01-15 VITALS — BP 126/64 | HR 76 | Temp 97.5°F | Resp 16

## 2022-01-15 DIAGNOSIS — Z515 Encounter for palliative care: Secondary | ICD-10-CM

## 2022-01-15 NOTE — Progress Notes (Signed)
PATIENT NAME: Terri Green DOB: 05-31-1926 MRN: [id number]  PRIMARY CARE PROVIDER: Lauree Chandler, NP  RESPONSIBLE PARTY:  Acct ID - Guarantor Home Phone Work Phone Relationship Acct Type  [id number] - [phone]  Self P/F     [place], [place],  [address]   Visit completed at the request of patient's daughter [name].  Dementia:  Patient with minimal engagement on this visit.  She is alert and watching television while in her room.  Staff report patient is wheelchair bound.  She is able to stand with assistance and take a couple of steps with her walker when transferring.  Patient is incontinent of bowel and bladder.  She continues to feed herself occasionally will need cues. FAST Score 7c.  Fall:  Staff report patient sustained an fall on 01/11/22.  No injuries were noted.   Follow up call made to daughter [name].  She feels patient is back to her baseline.  [name] verbalized knowing patient had a fall over the weekend.  She did daily visits over the weekend and was concerned because patient was not eating well and appeared uncomfortable. No other concerns voiced at this time.    HISTORY OF PRESENT ILLNESS:  88 year old female with Alzheimer's Dementia that has progressed.  Patient is followed by Palliative Care every 8-12 weeks and PRN.   CODE STATUS: DNR ADVANCED DIRECTIVES: Yes MOST FORM: Yes PPS: 30%   PHYSICAL EXAM:   VITALS: Today's Vitals   01/15/22 0902  BP: 126/64  Pulse: 76  Resp: 16  Temp: (!) 97.5 F (36.4 C)  SpO2: 95%    LUNGS: clear to auscultation  CARDIAC: Cor RRR}  EXTREMITIES: - for edema SKIN: Skin color, texture, turgor normal. No rashes or lesions or normal  NEURO: positive for gait problems and memory problems       Lorenza Burton, RN

## 2022-01-24 ENCOUNTER — Other Ambulatory Visit: Payer: Self-pay | Admitting: Nurse Practitioner

## 2022-01-24 DIAGNOSIS — F02818 Dementia in other diseases classified elsewhere, unspecified severity, with other behavioral disturbance: Secondary | ICD-10-CM

## 2022-01-30 ENCOUNTER — Non-Acute Institutional Stay: Payer: Medicare Other | Admitting: Internal Medicine

## 2022-01-30 ENCOUNTER — Encounter: Payer: Self-pay | Admitting: Internal Medicine

## 2022-01-30 VITALS — BP 145/99 | HR 88 | Temp 97.7°F | Resp 16 | Wt 126.5 lb

## 2022-01-30 DIAGNOSIS — G301 Alzheimer's disease with late onset: Secondary | ICD-10-CM

## 2022-01-30 DIAGNOSIS — Z515 Encounter for palliative care: Secondary | ICD-10-CM

## 2022-01-30 NOTE — Progress Notes (Signed)
[facility] Palliative [facility] Follow-Up Visit Telephone: [phone]  Fax: [phone]   Date of encounter: 01/30/22 12:35 PM PATIENT NAME: Terri Green [address] [place] [address]   [phone] (home)  DOB: 04-09-1927 MRN: [id number] PRIMARY CARE PROVIDER:    Lauree Chandler, NP,  [place] [address] [phone]  REFERRING PROVIDER:   Lauree Chandler, NP [place],  [place] [address] [phone]  RESPONSIBLE PARTY:    Contact Information     Name Relation Home Work [place] Daughter [phone]  [phone]        I met face to face with patient and family in [facility]. Palliative Care was asked to follow this patient by consultation request of  Lauree Chandler, NP to address advance care planning and complex medical decision making. This is follow-up visit.                                     ASSESSMENT AND PLAN / RECOMMENDATIONS:   Advance Care Planning/Goals of Care: Goals include to maximize quality of life and symptom management. Patient/health care surrogate gave his/her permission to discuss.Our advance care planning conversation included a discussion about:    The value and importance of advance care planning  Experiences with loved ones who have been seriously ill or have died  Exploration of personal, cultural or spiritual beliefs that might influence medical decisions  Exploration of goals of care in the event of a sudden injury or illness  Identification  of a healthcare agent  Review and updating or creation of an  advance directive document . Decision not to resuscitate or to de-escalate disease focused treatments due to poor prognosis. CODE STATUS:  DNR, MOST on file as previously updated with her daughter, Terri Green  Symptom Management/Plan: 1. 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 Palliative care encounter -will continue to follow her over time and provide recommendations for comfort and qol as needed -also follow for need for hospice services--is technically eligible based on FAST 7c or greater if Terri Green is ready for this though pt eating well  Follow up Palliative Care Visit: Palliative care will continue to follow for complex medical decision making, advance care planning, and clarification of goals. Return about 12 weeks and prn.  This visit was coded based on medical decision making (MDM).  PPS: 40%  HOSPICE ELIGIBILITY/DIAGNOSIS: TBD  Chief Complaint: Follow-up palliative visit  HISTORY OF PRESENT ILLNESS:  Terri Green is a 88 y.o. year old female  with severe AD, constipation, depression, and insomnia seen in palliative f/u at [facility] in [facility].    No longer ambulatory, uses wheelchair now, still eating very well 100% of pureed meals, goes to some activities.  Total care with adls and transfers now.   History obtained from review of  EMR, discussion with primary team, and interview with family, facility staff/caregiver and/or Terri Green.  I reviewed available labs, medications, imaging, studies and related documents from the EMR.  Records reviewed and summarized above.   ROS Review of Systems  Constitutional:  Positive for fatigue. Negative for activity change, appetite change and unexpected weight change.  HENT:  Positive for  trouble swallowing.        Doing ok with pureed diet without aspiration pneumonias  Respiratory:  Negative for chest tightness and shortness of breath.   Cardiovascular:  Negative for chest pain and leg swelling.  Gastrointestinal:  Positive for constipation.       Under control with meds  Genitourinary:  Negative for dysuria.  Musculoskeletal:  Positive for gait problem.  Skin:  Negative for color change.  Neurological:  Positive for weakness.  Psychiatric/Behavioral:  Positive for confusion.     Physical Exam: Vitals:   01/30/22 1234  BP: (!) 145/99  Pulse: 88  Resp: 16  Temp: 97.7 F (36.5 C)  SpO2: 98%  Weight: 126 lb 8 oz (57.4 kg)   Body mass index is 21.38 kg/m. Wt Readings from Last 500 Encounters:  01/30/22 126 lb 8 oz (57.4 kg)  09/05/21 127 lb 8 oz (57.8 kg)  07/23/21 130 lb (59 kg)  05/23/21 126 lb 8 oz (57.4 kg)  12/07/20 118 lb 6.4 oz (53.7 kg)  04/27/20 113 lb 15.7 oz (51.7 kg)  12/15/19 114 lb (51.7 kg)  11/01/19 110 lb (49.9 kg)  05/24/19 130 lb (59 kg)  12/23/18 133 lb (60.3 kg)   Physical Exam Constitutional:      General: She is not in acute distress.    Comments: Frail female sitting in wheelchair  Cardiovascular:     Rate and Rhythm: Normal rate and regular rhythm.  Pulmonary:     Effort: Pulmonary effort is normal.     Breath sounds: Normal breath sounds.  Abdominal:     General: Bowel sounds are normal.  Musculoskeletal:        General: Normal range of motion.     Right lower leg: No edema.     Left lower leg: No edema.  Skin:    General: Skin is warm and dry.     Comments: No pressure injuries  Neurological:     General: No focal deficit present.     Mental Status: She is alert.  Psychiatric:     Comments: Smiles some, talks in word salads only     CURRENT PROBLEM LIST:  Patient Active Problem List   Diagnosis Date Noted   Constipation 05/24/2019   Anemia 05/06/2018   Vitamin D deficiency 07/18/2016   Anxiety 06/21/2015    Allergic rhinitis due to pollen 10/19/2014   Dementia ([place]) with behaviors  09/07/2014   Poor appetite 09/07/2014   Pure hypercholesterolemia, unspecified 06/01/2014   Benign essential hypertension 06/01/2014    PAST MEDICAL HISTORY:  Active Ambulatory Problems    Diagnosis Date Noted   Constipation 05/24/2019   Anemia 05/06/2018   Anxiety 06/21/2015   Dementia ([place]) with behaviors  09/07/2014   Allergic rhinitis due to pollen 10/19/2014   Vitamin D deficiency 07/18/2016   Pure hypercholesterolemia, unspecified 06/01/2014   Benign essential hypertension 06/01/2014   Poor appetite 09/07/2014   Resolved Ambulatory Problems    Diagnosis Date Noted   Fecal impaction ([place]) 05/24/2019   Past Medical History:  Diagnosis Date   Bradycardia    Dehydration    Unresponsive episode  SOCIAL HX:  Social History   Tobacco Use   Smoking status: Never   Smokeless tobacco: Never  Substance Use Topics   Alcohol use: Never     ALLERGIES:  Allergies  Allergen Reactions   Penicillins       PERTINENT MEDICATIONS:  Outpatient Encounter Medications as of 01/30/2022  Medication Sig   acetaminophen (TYLENOL) 325 MG tablet Take 1 tablet (325 mg total) by mouth every 6 (six) hours as needed.   ALPRAZolam (XANAX) 0.25 MG tablet Take 1 tablet (0.25 mg total) by mouth daily as needed for anxiety.   guaiFENesin (ROBITUSSIN) 100 MG/5ML liquid Take 15 mLs by mouth 3 (three) times daily. X 1 week then every 8 hours as needed for cough and congestion   Melatonin 5 MG CHEW Chew 1 tablet by mouth at bedtime.   polyethylene glycol (MIRALAX / GLYCOLAX) 17 g packet Take 17 g by mouth daily.   QUEtiapine (SEROQUEL) 50 MG tablet TAKE 1 TABLET(50 MG) BY MOUTH AT BEDTIME   sennosides-docusate sodium (SENOKOT-S) 8.6-50 MG tablet Take 1 tablet by mouth daily.   sertraline (ZOLOFT) 100 MG tablet TAKE 2 TABLETS BY MOUTH DAILY   No facility-administered encounter medications on file as of 01/30/2022.     Thank you for the opportunity to participate in the care of Ms. Lalley.  The palliative care team will continue to follow. Please call our office at [phone] if we can be of additional assistance.   Hollace Kinnier, DO  COVID-19 PATIENT SCREENING TOOL Asked and negative response unless otherwise noted:  Have you had symptoms of covid, tested positive or been in contact with someone with symptoms/positive test in the past 5-10 days? No

## 2022-03-04 ENCOUNTER — Encounter: Payer: Self-pay | Admitting: Nurse Practitioner

## 2022-05-07 ENCOUNTER — Encounter: Payer: Medicare Other | Admitting: Nurse Practitioner

## 2022-05-16 ENCOUNTER — Ambulatory Visit (INDEPENDENT_AMBULATORY_CARE_PROVIDER_SITE_OTHER): Payer: Medicare Other | Admitting: Nurse Practitioner

## 2022-05-16 ENCOUNTER — Encounter: Payer: Self-pay | Admitting: Nurse Practitioner

## 2022-05-16 DIAGNOSIS — Z0000 Encounter for general adult medical examination without abnormal findings: Secondary | ICD-10-CM

## 2022-05-16 NOTE — Progress Notes (Signed)
Subjective:   Terri Green is a 88 y.o. female who presents for Medicare Annual (Subsequent) preventive examination.  Review of Systems     Cardiac Risk Factors include: sedentary lifestyle;advanced age (>56mn, >>11women)     Objective:    There were no vitals filed for this visit. There is no height or weight on file to calculate BMI.     05/16/2022    9:56 AM 07/23/2021   11:28 AM 05/02/2021    4:17 PM 11/20/2020    4:09 AM 04/27/2020    5:10 PM 12/15/2019   11:02 AM 11/01/2019   10:39 AM  Advanced Directives  Does Patient Have a Medical Advance Directive? Yes Yes Yes No Yes Yes Yes  Type of AParamedicof AElizabeth LakeLiving will;Out of facility DNR (pink MOST or yellow form) HBairdstownLiving will;Out of facility DNR (pink MOST or yellow form) HMaple GlenLiving will  HKen CarylOut of facility DNR (pink MOST or yellow form) HWillimanticOut of facility DNR (pink MOST or yellow form) Living will;Healthcare Power of AMyrtle SpringsOut of facility DNR (pink MOST or yellow form)  Does patient want to make changes to medical advance directive? No - Patient declined No - Patient declined No - Patient declined  No - Patient declined No - Patient declined No - Patient declined  Copy of HAshfordin Chart? Yes - validated most recent copy scanned in chart (See row information) Yes - validated most recent copy scanned in chart (See row information) Yes - validated most recent copy scanned in chart (See row information)  No - copy requested Yes - validated most recent copy scanned in chart (See row information) Yes - validated most recent copy scanned in chart (See row information)  Would patient like information on creating a medical advance directive?    No - Patient declined No - Patient declined    Pre-existing out of facility DNR order (yellow form or pink MOST form)  Yellow form placed  in chart (order not valid for inpatient use);Pink MOST form placed in chart (order not valid for inpatient use)    Yellow form placed in chart (order not valid for inpatient use);Pink MOST form placed in chart (order not valid for inpatient use)     Current Medications (verified) Outpatient Encounter Medications as of 05/16/2022  Medication Sig   acetaminophen (TYLENOL) 325 MG tablet Take 1 tablet (325 mg total) by mouth every 6 (six) hours as needed.   ALPRAZolam (XANAX) 0.25 MG tablet Take 1 tablet (0.25 mg total) by mouth daily as needed for anxiety.   guaiFENesin (ROBITUSSIN) 100 MG/5ML liquid Take 15 mLs by mouth 3 (three) times daily. X 1 week then every 8 hours as needed for cough and congestion   Melatonin 5 MG CHEW Chew 1 tablet by mouth at bedtime.   polyethylene glycol (MIRALAX / GLYCOLAX) 17 g packet Take 17 g by mouth daily.   QUEtiapine (SEROQUEL) 50 MG tablet TAKE 1 TABLET(50 MG) BY MOUTH AT BEDTIME   sennosides-docusate sodium (SENOKOT-S) 8.6-50 MG tablet Take 1 tablet by mouth daily.   sertraline (ZOLOFT) 100 MG tablet TAKE 2 TABLETS BY MOUTH DAILY   No facility-administered encounter medications on file as of 05/16/2022.    Allergies (verified) Penicillins   History: Past Medical History:  Diagnosis Date   Bradycardia    Dehydration    Dementia (HCC)    Unresponsive episode    History reviewed.  No pertinent surgical history. History reviewed. No pertinent family history. Social History   Socioeconomic History   Marital status: Widowed    Spouse name: Not on file   Number of children: Not on file   Years of education: Not on file   Highest education level: Not on file  Occupational History   Not on file  Tobacco Use   Smoking status: Never   Smokeless tobacco: Never  Vaping Use   Vaping Use: Never used  Substance and Sexual Activity   Alcohol use: Never   Drug use: Never   Sexual activity: Not on file  Other Topics Concern   Not on file  Social  History Narrative   Social History      Diet? General       Do you drink/eat things with caffeine? No    Marital status?                  Divorced                   What year were you married?  1949      Do you live in a house, apartment, assisted living, condo, trailer, etc.?  house      Is it one or more stories? One       How many persons live in your home? 2      Do you have any pets in your home? (please list)  No       Highest level of education completed? 12th grade       Current or past profession:      Do you exercise?      Moderate                                 Type & how often? Walk       Advanced Directives      Do you have a living will? Yes      Do you have a DNR form?                                  If not, do you want to discuss one? Yes      Do you have signed POA/HPOA for forms? Yes      Functional Status      Do you have difficulty bathing or dressing yourself? Yes      Do you have difficulty preparing food or eating? Yes       Do you have difficulty managing your medications? Yes       Do you have difficulty managing your finances? Yes       Do you have difficulty affording your medications? No       Social Determinants of Health   Financial Resource Strain: Not on file  Food Insecurity: Not on file  Transportation Needs: Not on file  Physical Activity: Not on file  Stress: Not on file  Social Connections: Not on file    Tobacco Counseling Counseling given: Not Answered   Clinical Intake:                 Diabetic?no         Activities of Daily Living    05/16/2022   10:11 AM 05/16/2022    9:57 AM  In your present state of health,  do you have any difficulty performing the following activities:  Hearing?  0  Vision?  0  Difficulty concentrating or making decisions?  1  Comment  she has dementia  Walking or climbing stairs? 1 0  Dressing or bathing? 1 0  Doing errands, shopping?  1  Preparing Food and eating  ? Y   Using the Toilet? Y   In the past six months, have you accidently leaked urine? Y   Do you have problems with loss of bowel control? N   Managing your Medications? Y   Managing your Finances? Y   Housekeeping or managing your Housekeeping? Y     Patient Care Team: Lauree Chandler, NP as PCP - General (Geriatric Medicine)  Indicate any recent Medical Services you may have received from other than [facility] providers in the past year (date may be approximate).     Assessment:   This is a routine wellness examination for [place].  Hearing/Vision screen Vision Screening - Comments:: Patient has not had a vision screening   Dietary issues and exercise activities discussed: Current Exercise Habits: The patient does not participate in regular exercise at present   Goals Addressed   None   Depression Screen    05/16/2022    9:56 AM 07/23/2021    3:24 PM 05/02/2021    4:21 PM 01/01/2019    2:52 PM 12/23/2018    1:18 PM  PHQ 2/9 Scores  PHQ - 2 Score 0 0 0 0 0    Fall Risk    05/16/2022    9:56 AM 07/23/2021    3:24 PM 06/07/2021    2:18 PM 05/02/2021    4:17 PM 12/15/2019   11:08 AM  Fall Risk   Falls in the past year? 0 0 0 1 0  Number falls in past yr: 0 0 0 1 0  Injury with Fall? 0 0  0 0  Risk for fall due to : Impaired balance/gait;Impaired mobility No Fall Risks No Fall Risks History of fall(s)   Follow up Falls evaluation completed Falls evaluation completed Falls evaluation completed Falls evaluation completed     [place]:  Any stairs in or around the home? No  If so, are there any without handrails? No  Home free of loose throw rugs in walkways, pet beds, electrical cords, etc? Yes  Adequate lighting in your home to reduce risk of falls? Yes   ASSISTIVE DEVICES UTILIZED TO PREVENT FALLS:  Life alert? No  Use of a cane, walker or w/c? Yes  Grab bars in the bathroom? Yes  Shower chair or bench in shower? Yes  Elevated  toilet seat or a handicapped toilet? Yes   TIMED UP AND GO:  Was the test performed? No .    Cognitive Function:        Immunizations Immunization History  Administered Date(s) Administered   Fluad Quad(high Dose 65+) 03/04/2019, 03/06/2021, 02/13/2022   Influenza Split 02/24/2014, 05/24/2015   Influenza, High Dose Seasonal PF 02/20/2017, 02/27/2018   Influenza,inj,Quad PF,6+ Mos 02/24/2014, 05/24/2015, 03/01/2016   Influenza,inj,quad, With Preservative 02/20/2017   Influenza-Unspecified 03/04/2020   Moderna Covid-19 Vaccine Bivalent Booster 50yr & up 04/09/2021   Moderna Sars-Covid-2 Vaccination 06/08/2019, 07/06/2019   PPD Test 12/28/2014, 05/24/2015, 10/14/2017, 11/01/2019   Pneumococcal Conjugate-13 11/30/2014   Pneumococcal Polysaccharide-23 12/25/2012   Zoster Recombinat (Shingrix) 01/26/2015    TDAP status: Due, Education has been provided regarding the importance of this vaccine. Advised may  receive this vaccine at local pharmacy or Health Dept. Aware to provide a copy of the vaccination record if obtained from local pharmacy or Health Dept. Verbalized acceptance and understanding.  Flu Vaccine status: Up to date  Pneumococcal vaccine status: Due, Education has been provided regarding the importance of this vaccine. Advised may receive this vaccine at local pharmacy or Health Dept. Aware to provide a copy of the vaccination record if obtained from local pharmacy or Health Dept. Verbalized acceptance and understanding.  Covid-19 vaccine status: Information provided on how to obtain vaccines.   Qualifies for Shingles Vaccine? Yes   Zostavax completed No   Shingrix Completed?: No.    Education has been provided regarding the importance of this vaccine. Patient has been advised to call insurance company to determine out of pocket expense if they have not yet received this vaccine. Advised may also receive vaccine at local pharmacy or Health Dept. Verbalized acceptance and  understanding.  Screening Tests Health Maintenance  Topic Date Due   DTaP/Tdap/Td (1 - Tdap) Never done   Zoster Vaccines- Shingrix (2 of 2) 03/23/2015   COVID-19 Vaccine (4 - 2023-24 season) 01/25/2022   Medicare Annual Wellness (AWV)  05/17/2023   Pneumonia Vaccine 60+ Years old  Completed   INFLUENZA VACCINE  Completed   HPV VACCINES  Aged Out   DEXA SCAN  Discontinued    Health Maintenance  Health Maintenance Due  Topic Date Due   DTaP/Tdap/Td (1 - Tdap) Never done   Zoster Vaccines- Shingrix (2 of 2) 03/23/2015   COVID-19 Vaccine (4 - 2023-24 season) 01/25/2022    Colorectal cancer screening: No longer required.   Mammogram status: No longer required due to age.   Lung Cancer Screening: (Low Dose CT Chest recommended if Age 77-80 years, 30 pack-year currently smoking OR have quit w/in 15years.) does not qualify.   Lung Cancer Screening Referral: na  Additional Screening:  Hepatitis C Screening: does not qualify; Completed na  Vision Screening: Recommended annual ophthalmology exams for early detection of glaucoma and other disorders of the eye. Is the patient up to date with their annual eye exam?  No  Who is the provider or what is the name of the office in which the patient attends annual eye exams?  If pt is not established with a provider, would they like to be referred to a provider to establish care? No .   Dental Screening: Recommended annual dental exams for proper oral hygiene  Community Resource Referral / Chronic Care Management: CRR required this visit?  No   CCM required this visit?  No      Plan:     I have personally reviewed and noted the following in the patient's chart:   Medical and social history Use of alcohol, tobacco or illicit drugs  Current medications and supplements including opioid prescriptions. Patient is not currently taking opioid prescriptions. Functional ability and status Nutritional status Physical  activity Advanced directives List of other physicians Hospitalizations, surgeries, and ER visits in previous 12 months Vitals Screenings to include cognitive, depression, and falls Referrals and appointments  In addition, I have reviewed and discussed with patient certain preventive protocols, quality metrics, and best practice recommendations. A written personalized care plan for preventive services as well as general preventive health recommendations were provided to patient.     Lauree Chandler, NP   05/16/2022    Virtual Visit via Telephone Note  I connected with patient 05/16/22 at 10:00 AM EST by telephone  and verified that I am speaking with the correct person using two identifiers.  Location: Patient: home Provider: [facility]    I discussed the limitations, risks, security and privacy concerns of performing an evaluation and management service by telephone and the availability of in person appointments. I also discussed with the patient that there may be a patient responsible charge related to this service. The patient expressed understanding and agreed to proceed.   I discussed the assessment and treatment plan with the patient. The patient was provided an opportunity to ask questions and all were answered. The patient agreed with the plan and demonstrated an understanding of the instructions.   The patient was advised to call back or seek an in-person evaluation if the symptoms worsen or if the condition fails to improve as anticipated.  I provided 15 minutes of non-face-to-face time during this encounter.  [employer]. Harle Battiest Avs printed and mailed

## 2022-05-16 NOTE — Patient Instructions (Signed)
[name] , Thank you for taking time to come for your Medicare Wellness Visit. I appreciate your ongoing commitment to your health goals. Please review the following plan we discussed and let me know if I can assist you in the future.   Screening recommendations/referrals: Colonoscopy aged out Mammogram aged out Bone Density decline Recommended yearly ophthalmology/optometry visit for glaucoma screening and checkup Recommended yearly dental visit for hygiene and checkup  Vaccinations: Influenza vaccine- due annually in September/October Pneumococcal vaccine up to date Tdap vaccine DUE- recommend to get at your local pharmacy Shingles vaccine DUE- recommend to get at your local pharmacy  - if you have these records please send to [place].   Advanced directives: on file   Conditions/risks identified: advance age, progressive dementia.   Next appointment: yearly    Preventive Care 86 Years and Older, Female Preventive care refers to lifestyle choices and visits with your health care provider that can promote health and wellness. What does preventive care include? A yearly physical exam. This is also called an annual well check. Dental exams once or twice a year. Routine eye exams. Ask your health care provider how often you should have your eyes checked. Personal lifestyle choices, including: Daily care of your teeth and gums. Regular physical activity. Eating a healthy diet. Avoiding tobacco and drug use. Limiting alcohol use. Practicing safe sex. Taking low-dose aspirin every day. Taking vitamin and mineral supplements as recommended by your health care provider. What happens during an annual well check? The services and screenings done by your health care provider during your annual well check will depend on your age, overall health, lifestyle risk factors, and family history of disease. Counseling  Your health care provider may ask you questions about your: Alcohol  use. Tobacco use. Drug use. Emotional well-being. Home and relationship well-being. Sexual activity. Eating habits. History of falls. Memory and ability to understand (cognition). Work and work Statistician. Reproductive health. Screening  You may have the following tests or measurements: Height, weight, and BMI. Blood pressure. Lipid and cholesterol levels. These may be checked every 5 years, or more frequently if you are over 86 years old. Skin check. Lung cancer screening. You may have this screening every year starting at age 27 if you have a 30-pack-year history of smoking and currently smoke or have quit within the past 15 years. Fecal occult blood test (FOBT) of the stool. You may have this test every year starting at age 11. Flexible sigmoidoscopy or colonoscopy. You may have a sigmoidoscopy every 5 years or a colonoscopy every 10 years starting at age 12. Hepatitis C blood test. Hepatitis B blood test. Sexually transmitted disease (STD) testing. Diabetes screening. This is done by checking your blood sugar (glucose) after you have not eaten for a while (fasting). You may have this done every 1-3 years. Bone density scan. This is done to screen for osteoporosis. You may have this done starting at age 30. Mammogram. This may be done every 1-2 years. Talk to your health care provider about how often you should have regular mammograms. Talk with your health care provider about your test results, treatment options, and if necessary, the need for more tests. Vaccines  Your health care provider may recommend certain vaccines, such as: Influenza vaccine. This is recommended every year. Tetanus, diphtheria, and acellular pertussis (Tdap, Td) vaccine. You may need a Td booster every 10 years. Zoster vaccine. You may need this after age 75. Pneumococcal 13-valent conjugate (PCV13) vaccine. One dose is  recommended after age 66. Pneumococcal polysaccharide (PPSV23) vaccine. One dose is  recommended after age 78. Talk to your health care provider about which screenings and vaccines you need and how often you need them. This information is not intended to replace advice given to you by your health care provider. Make sure you discuss any questions you have with your health care provider. Document Released: 06/09/2015 Document Revised: 01/31/2016 Document Reviewed: 03/14/2015 Elsevier Interactive Patient Education  2017 [place] Prevention in the Home Falls can cause injuries. They can happen to people of all ages. There are many things you can do to make your home safe and to help prevent falls. What can I do on the outside of my home? Regularly fix the edges of walkways and driveways and fix any cracks. Remove anything that might make you trip as you walk through a door, such as a raised step or threshold. Trim any bushes or trees on the path to your home. Use bright outdoor lighting. Clear any walking paths of anything that might make someone trip, such as rocks or tools. Regularly check to see if handrails are loose or broken. Make sure that both sides of any steps have handrails. Any raised decks and porches should have guardrails on the edges. Have any leaves, snow, or ice cleared regularly. Use sand or salt on walking paths during winter. Clean up any spills in your garage right away. This includes oil or grease spills. What can I do in the bathroom? Use night lights. Install grab bars by the toilet and in the tub and shower. Do not use towel bars as grab bars. Use non-skid mats or decals in the tub or shower. If you need to sit down in the shower, use a plastic, non-slip stool. Keep the floor dry. Clean up any water that spills on the floor as soon as it happens. Remove soap buildup in the tub or shower regularly. Attach bath mats securely with double-sided non-slip rug tape. Do not have throw rugs and other things on the floor that can make you  trip. What can I do in the bedroom? Use night lights. Make sure that you have a light by your bed that is easy to reach. Do not use any sheets or blankets that are too big for your bed. They should not hang down onto the floor. Have a firm chair that has side arms. You can use this for support while you get dressed. Do not have throw rugs and other things on the floor that can make you trip. What can I do in the kitchen? Clean up any spills right away. Avoid walking on wet floors. Keep items that you use a lot in easy-to-reach places. If you need to reach something above you, use a strong step stool that has a grab bar. Keep electrical cords out of the way. Do not use floor polish or wax that makes floors slippery. If you must use wax, use non-skid floor wax. Do not have throw rugs and other things on the floor that can make you trip. What can I do with my stairs? Do not leave any items on the stairs. Make sure that there are handrails on both sides of the stairs and use them. Fix handrails that are broken or loose. Make sure that handrails are as long as the stairways. Check any carpeting to make sure that it is firmly attached to the stairs. Fix any carpet that is loose or worn. Avoid having  throw rugs at the top or bottom of the stairs. If you do have throw rugs, attach them to the floor with carpet tape. Make sure that you have a light switch at the top of the stairs and the bottom of the stairs. If you do not have them, ask someone to add them for you. What else can I do to help prevent falls? Wear shoes that: Do not have high heels. Have rubber bottoms. Are comfortable and fit you well. Are closed at the toe. Do not wear sandals. If you use a stepladder: Make sure that it is fully opened. Do not climb a closed stepladder. Make sure that both sides of the stepladder are locked into place. Ask someone to hold it for you, if possible. Clearly mark and make sure that you can  see: Any grab bars or handrails. First and last steps. Where the edge of each step is. Use tools that help you move around (mobility aids) if they are needed. These include: Canes. Walkers. Scooters. Crutches. Turn on the lights when you go into a dark area. Replace any light bulbs as soon as they burn out. Set up your furniture so you have a clear path. Avoid moving your furniture around. If any of your floors are uneven, fix them. If there are any pets around you, be aware of where they are. Review your medicines with your doctor. Some medicines can make you feel dizzy. This can increase your chance of falling. Ask your doctor what other things that you can do to help prevent falls. This information is not intended to replace advice given to you by your health care provider. Make sure you discuss any questions you have with your health care provider. Document Released: 03/09/2009 Document Revised: 10/19/2015 Document Reviewed: 06/17/2014 Elsevier Interactive Patient Education  2017 [employer].

## 2022-06-07 ENCOUNTER — Telehealth: Payer: Self-pay | Admitting: Family Medicine

## 2022-06-07 ENCOUNTER — Non-Acute Institutional Stay: Payer: Medicare Other | Admitting: Family Medicine

## 2022-06-07 ENCOUNTER — Encounter: Payer: Self-pay | Admitting: Family Medicine

## 2022-06-07 VITALS — BP 128/78 | HR 62 | Temp 97.2°F | Resp 18

## 2022-06-07 DIAGNOSIS — Z993 Dependence on wheelchair: Secondary | ICD-10-CM

## 2022-06-07 DIAGNOSIS — R1310 Dysphagia, unspecified: Secondary | ICD-10-CM

## 2022-06-07 DIAGNOSIS — G301 Alzheimer's disease with late onset: Secondary | ICD-10-CM

## 2022-06-07 NOTE — Telephone Encounter (Signed)
TCT pt's daughter [name] on her cell phone.  Introduced self as [place] NP and confirmed she has my contact phone number in her phone for any questions or concerns.  Advised that she was seen today and looked pretty stable on exam.  She states a concern for pt no longer being ambulatory and asked if there was any intervention for this issue.  Advised that my assessment of pt's cognition and what was shared by the staff was that she is poorly conversant.  Advised she said she was hungry but they were in the process of getting her lunch.  Advised once dementia is advanced enough to affect communication that her ability to retain instruction is probably not present.  Advised that with PT, pt has to be able to retain instruction and make progress towards goals of ambulation to continue therapy and believed that she was too advanced for this to be effective.  Advised I would follow up monthly but if she has concerns to please not hesitate contacting me.  Damaris Hippo FNP-C

## 2022-06-07 NOTE — Progress Notes (Unsigned)
[facility] Consult Note Telephone: [phone]  Fax: [phone]    Date of encounter: 06/07/22 3:52 PM PATIENT NAME: Terri Green [address] [place] [address]   [phone] (home)  DOB: [birth year] MRN: [id number] PRIMARY CARE PROVIDER:    Lauree Chandler, NP,  [place] [address] [phone]  REFERRING PROVIDER:   Lauree Chandler, NP [place],  [place] [address] [phone]  RESPONSIBLE PARTY:    Contact Information     Name Relation Home Work [place] Daughter [phone]  [phone]        I met face to face with patient and family in *** home/facility. Palliative Care was asked to follow this patient by consultation request of  Lauree Chandler, NP to address advance care planning and complex medical decision making. This is a follow up visit   ASSESSMENT , SYMPTOM MANAGEMENT AND PLAN / RECOMMENDATIONS:   Advance Care Planning/Goals of Care: Goals include to maximize quality of life and symptom management. Patient/health care surrogate gave his/her permission to discuss. Our advance care planning conversation included a discussion about:    The value and importance of advance care planning  Experiences with loved ones who have been seriously ill or have died  Exploration of personal, cultural or spiritual beliefs that might influence medical decisions  Exploration of goals of care in the event of a sudden injury or illness  Identification of a healthcare agent  Review and updating or creation of an  advance directive document . Decision not to resuscitate or to de-escalate disease focused treatments due to poor prognosis. CODE STATUS:      Follow up Palliative Care Visit: Palliative care will continue to follow for complex medical decision making, advance care planning, and clarification of goals. Return *** weeks or prn.  I  This  visit was coded based on medical decision making (MDM).  PPS: ***0%  HOSPICE ELIGIBILITY/DIAGNOSIS: TBD  Chief Complaint: ***  HISTORY OF PRESENT ILLNESS:  Terri Green is a 89 y.o. year old female with *** .   History obtained from review of EMR, discussion with primary team, and interview with family, facility staff/caregiver and/or Terri Green.  I reviewed EMR for available labs, medications, imaging, studies and related documents.  There are no new records since last visit/Records reviewed and summarized above.   ROS General: NAD EYES: denies vision changes ENMT: denies dysphagia Cardiovascular: denies chest pain, denies DOE Pulmonary: denies cough, denies increased SOB Abdomen: endorses good appetite, denies constipation, endorses continence of bowel GU: denies dysuria, endorses continence of urine MSK:  denies increased weakness,  no falls reported Skin: denies rashes or wounds Neurological: denies pain, denies insomnia Psych: Endorses positive mood Heme/lymph/immuno: denies bruises, abnormal bleeding  Physical Exam: Current and past weights: Weight 126 lbs 8 oz on 01/30/22 Palliative Care visit,  Constitutional: NAD General: frail appearing, thin/WNWD/obese  EYES: anicteric sclera, lids intact, no discharge  ENMT: intact hearing, oral mucous membranes moist, dentition intact CV: S1S2, RRR, no LE edema Pulmonary: LCTA, no increased work of breathing, no cough, room air Abdomen: intake 100%, normo-active BS + 4 quadrants, soft and non tender, no ascites GU: deferred MSK: no sarcopenia, moves all extremities, ambulatory Skin: warm and dry, no rashes or wounds on visible skin Neuro:  no generalized weakness,  no cognitive impairment Psych: non-anxious affect, A and O x 3 Hem/lymph/immuno: no widespread bruising   Thank you for  the opportunity to participate in the care of Terri Green.  The palliative care team will continue to follow. Please call our office at  [phone] if we can be of additional assistance.   Marijo Conception, FNP -C  COVID-19 PATIENT SCREENING TOOL Asked and negative response unless otherwise noted:   Have you had symptoms of covid, tested positive or been in contact with someone with symptoms/positive test in the past 5-10 days?  unknown

## 2022-06-08 ENCOUNTER — Encounter: Payer: Self-pay | Admitting: Family Medicine

## 2022-06-08 DIAGNOSIS — Z993 Dependence on wheelchair: Secondary | ICD-10-CM | POA: Insufficient documentation

## 2022-06-20 ENCOUNTER — Telehealth: Payer: Self-pay

## 2022-06-20 NOTE — Telephone Encounter (Signed)
Home health has called and states that they will be sending in some paper work over in regards to the patient for PCP or covering Provider to sign.

## 2022-06-20 NOTE — Telephone Encounter (Signed)
Noted, covering provider will be Marlowe Sax, NP

## 2022-06-20 NOTE — Telephone Encounter (Signed)
Awaiting for forms then will fill.

## 2022-08-10 ENCOUNTER — Emergency Department (HOSPITAL_COMMUNITY)
Admission: EM | Admit: 2022-08-10 | Discharge: 2022-08-10 | Disposition: A | Discharge status: Skilled Nursing Facility | Payer: Medicare Other | Attending: Emergency Medicine | Admitting: Emergency Medicine

## 2022-08-10 ENCOUNTER — Encounter (HOSPITAL_COMMUNITY): Payer: Self-pay

## 2022-08-10 DIAGNOSIS — D649 Anemia, unspecified: Secondary | ICD-10-CM

## 2022-08-10 DIAGNOSIS — E860 Dehydration: Secondary | ICD-10-CM | POA: Diagnosis not present

## 2022-08-10 DIAGNOSIS — R112 Nausea with vomiting, unspecified: Secondary | ICD-10-CM | POA: Diagnosis present

## 2022-08-10 DIAGNOSIS — F0390 Unspecified dementia without behavioral disturbance: Secondary | ICD-10-CM | POA: Diagnosis not present

## 2022-08-10 LAB — COMPREHENSIVE METABOLIC PANEL
ALT: 14 U/L (ref 0–44)
AST: 17 U/L (ref 15–41)
Albumin: 4 g/dL (ref 3.5–5.0)
Alkaline Phosphatase: 61 U/L (ref 38–126)
Anion gap: 9 (ref 5–15)
BUN: 30 mg/dL — ABNORMAL HIGH (ref 8–23)
CO2: 26 mmol/L (ref 22–32)
Calcium: 9.4 mg/dL (ref 8.9–10.3)
Chloride: 102 mmol/L (ref 98–111)
Creatinine, Ser: 1.15 mg/dL — ABNORMAL HIGH (ref 0.44–1.00)
GFR, Estimated: 44 mL/min — ABNORMAL LOW (ref >60)
Glucose, Bld: 122 mg/dL — ABNORMAL HIGH (ref 70–99)
Potassium: 4 mmol/L (ref 3.5–5.1)
Sodium: 137 mmol/L (ref 135–145)
Total Bilirubin: 0.6 mg/dL (ref 0.3–1.2)
Total Protein: 7.8 g/dL (ref 6.5–8.1)

## 2022-08-10 LAB — CBC WITH DIFFERENTIAL/PLATELET
Abs Immature Granulocytes: 0.02 10*3/uL (ref 0.00–0.07)
Basophils Absolute: 0 10*3/uL (ref 0.0–0.1)
Basophils Relative: 0 %
Eosinophils Absolute: 0.1 10*3/uL (ref 0.0–0.5)
Eosinophils Relative: 2 %
HCT: 32.5 % — ABNORMAL LOW (ref 36.0–46.0)
Hemoglobin: 10.1 g/dL — ABNORMAL LOW (ref 12.0–15.0)
Immature Granulocytes: 0 %
Lymphocytes Relative: 11 %
Lymphs Abs: 0.8 10*3/uL (ref 0.7–4.0)
MCH: 27.7 pg (ref 26.0–34.0)
MCHC: 31.1 g/dL (ref 30.0–36.0)
MCV: 89 fL (ref 80.0–100.0)
Monocytes Absolute: 0.8 10*3/uL (ref 0.1–1.0)
Monocytes Relative: 11 %
Neutro Abs: 5.4 10*3/uL (ref 1.7–7.7)
Neutrophils Relative %: 76 %
Platelets: 230 10*3/uL (ref 150–400)
RBC: 3.65 MIL/uL — ABNORMAL LOW (ref 3.87–5.11)
RDW: 15.2 % (ref 11.5–15.5)
WBC: 7.2 10*3/uL (ref 4.0–10.5)
nRBC: 0 % (ref 0.0–0.2)

## 2022-08-10 LAB — PROTIME-INR
INR: 1.1 (ref 0.8–1.2)
Prothrombin Time: 13.7 seconds (ref 11.4–15.2)

## 2022-08-10 LAB — TYPE AND SCREEN
ABO/RH(D): A POS
Antibody Screen: NEGATIVE

## 2022-08-10 LAB — POC OCCULT BLOOD, ED: Fecal Occult Bld: NEGATIVE

## 2022-08-10 LAB — CBG MONITORING, ED: Glucose-Capillary: 92 mg/dL (ref 70–99)

## 2022-08-10 MED ORDER — SODIUM CHLORIDE 0.9 % IV BOLUS (SEPSIS)
1000.0000 mL | Freq: Once | INTRAVENOUS | Status: AC
  Administered 2022-08-10: 1000 mL via INTRAVENOUS

## 2022-08-10 NOTE — ED Notes (Signed)
[facility] called for pt. Transportation to [facility].

## 2022-08-10 NOTE — ED Provider Notes (Signed)
[place] EMERGENCY DEPARTMENT AT [facility] Provider Note   CSN: [id number] Arrival date & time: 08/10/22  0302     History  Chief Complaint  Patient presents with   Rectal Bleeding   Level 5 caveat due to dementia Katylin Okereke is a 89 y.o. female.  The history is provided by the EMS personnel.   Patient with history of dementia with psychosis presents with emesis. Entire history given by EMS.  They report patient had an episode of coffee-ground emesis.  It is unclear how any times she had these episodes and when it started.  Patient is not on anticoagulation.  Patient with altered mental status at baseline.  No other details are known arrival    Home Medications Prior to Admission medications   Medication Sig Start Date End Date Taking? Authorizing Provider  acetaminophen (TYLENOL) 325 MG tablet Take 1 tablet (325 mg total) by mouth every 6 (six) hours as needed. Patient taking differently: Take 325-650 mg by mouth as directed. Mild pain 325 mg Q 6 hours prn, 650 mg po Q 8 hrs prn moderate-severe pain 06/19/20   Lauree Chandler, NP  ALPRAZolam Duanne Moron) 0.25 MG tablet Take 1 tablet (0.25 mg total) by mouth daily as needed for anxiety. 12/13/20   Ngetich, Dinah C, NP  guaiFENesin (ROBITUSSIN) 100 MG/5ML liquid Take 15 mLs by mouth every 8 (eight) hours as needed for cough. cough and congestion    [provider]  Melatonin 5 MG CHEW Chew 1 tablet by mouth at bedtime.    [provider]  polyethylene glycol (MIRALAX / GLYCOLAX) 17 g packet Take 17 g by mouth daily.    [provider]  QUEtiapine (SEROQUEL) 50 MG tablet TAKE 1 TABLET(50 MG) BY MOUTH AT BEDTIME 01/24/22   Lauree Chandler, NP  sennosides-docusate sodium (SENOKOT-S) 8.6-50 MG tablet Take 1 tablet by mouth daily.    [provider]  sertraline (ZOLOFT) 100 MG tablet TAKE 2 TABLETS BY MOUTH DAILY 10/23/21   Lauree Chandler, NP      Allergies    Penicillins     Review of Systems   Review of Systems  Unable to perform ROS: Dementia    Physical Exam Updated Vital Signs BP (!) 143/72 (BP Location: Left Arm)   Pulse 88   Temp 97.6 F (36.4 C) (Oral)   Resp 18   SpO2 96%  Physical Exam CONSTITUTIONAL: Elderly, mildly agitated HEAD: Normocephalic/atraumatic EYES: EOMI/PERRL ENMT: Mucous membranes moist, hirsutism NECK: supple no meningeal signs CV: S1/S2 noted, no murmurs/rubs/gallops noted LUNGS: Lungs are clear to auscultation bilaterally, no apparent distress ABDOMEN: soft, nontender Rectal-stool color brown, no melena or blood.  Nurse Caryl Comes present for exam NEURO: Pt is awake/alert, confused, moves all extremities EXTREMITIES: pulses normal/equal, full ROM SKIN: warm, color normal PSYCH: Agitated  ED Results / Procedures / Treatments   Labs (all labs ordered are listed, but only abnormal results are displayed) Labs Reviewed  COMPREHENSIVE METABOLIC PANEL - Abnormal; Notable for the following components:      Result Value   Glucose, Bld 122 (*)    BUN 30 (*)    Creatinine, Ser 1.15 (*)    GFR, Estimated 44 (*)    All other components within normal limits  CBC WITH DIFFERENTIAL/PLATELET - Abnormal; Notable for the following components:   RBC 3.65 (*)    Hemoglobin 10.1 (*)    HCT 32.5 (*)    All other components within normal limits  PROTIME-INR  POC OCCULT BLOOD, ED  CBG MONITORING, ED  TYPE AND SCREEN  ABO/RH    EKG EKG Interpretation  Date/Time:  Saturday August 10 2022 03:17:21 EDT Ventricular Rate:  90 PR Interval:  137 QRS Duration: 82 QT Interval:  377 QTC Calculation: 462 R Axis:   -30 Text Interpretation: Sinus rhythm Left axis deviation Abnormal R-wave progression, late transition Interpretation limited secondary to artifact Confirmed by Ripley Fraise [id number]) on 08/10/2022 3:31:50 AM  Radiology No results found.  Procedures Procedures    Medications Ordered in ED Medications  sodium  chloride 0.9 % bolus 1,000 mL (1,000 mLs Intravenous New Bag/Given 08/10/22 0426)    ED Course/ Medical Decision Making/ A&P Clinical Course as of 08/10/22 0534  Sat Aug 10, 2022  0357 D/w daughter [name] at [phone] She was updated on plan.   Will call back with results  [DW]  [phone] Per records, pt   has MOST form, she is a DNR and on comfort care  No response when I called her nursing facility [DW]  0501 Patient stable, no acute distress [DW]  0534 Patient resting comfortably, no acute distress.  No focal abdominal tenderness or distention.  She is more calm and cooperative.  Labs reveal mild dehydration.  She has been given IV fluids.  She will be discharged back to facility.  No signs of acute GI bleed or other acute abdominal emergency. [DW]  [id number] I have informed her daughter of the results that she will be discharged back to facility. [DW]    Clinical Course User Index [DW] Ripley Fraise, MD                             Medical Decision Making Amount and/or Complexity of Data Reviewed Labs: ordered.   This patient presents to the ED for concern of vomiting, this involves an extensive number of treatment options, and is a complaint that carries with it a high risk of complications and morbidity.  The differential diagnosis includes but is not limited to bowel obstruction, gastritis, esophageal varices, peptic ulcer disease, acute coronary syndrome  Comorbidities that complicate the patient evaluation: Patient's presentation is complicated by their history of dementia  Social Determinants of Health: Patient's  lives in a nursing home   increases the complexity of managing their presentation  Additional history obtained: Additional history obtained from family Records reviewed  outpatient records reviewed  Lab Tests: I Ordered, and personally interpreted labs.  The pertinent results include: stable anemia, dehydration   Medicines ordered and prescription drug  management: I ordered medication including IV  fluids for dehydration  Reevaluation of the patient after these medicines showed that the patient    stayed the same  Reevaluation: After the interventions noted above, I reevaluated the patient and found that they have :improved  Complexity of problems addressed: Patient's presentation is most consistent with  acute presentation with potential threat to life or bodily function  Disposition: After consideration of the diagnostic results and the patient's response to treatment,  I feel that the patent would benefit from discharge   .           Final Clinical Impression(s) / ED Diagnoses Final diagnoses:  Nausea and vomiting, unspecified vomiting type  Dehydration  Chronic anemia    Rx / DC Orders ED Discharge Orders     None         Ripley Fraise, MD 08/10/22 [phone]

## 2022-08-10 NOTE — ED Triage Notes (Signed)
Pt. BIB [facility] from [facility] for vomiting coffee ground emesis x1 day. Pt. Not taking Blood thinners. Pt. Altered at baseline.

## 2022-08-13 NOTE — Progress Notes (Signed)
This encounter was created in error - please disregard.

## 2022-08-15 ENCOUNTER — Non-Acute Institutional Stay: Payer: Medicare Other | Admitting: Family Medicine

## 2022-08-15 ENCOUNTER — Encounter: Payer: Self-pay | Admitting: Family Medicine

## 2022-08-15 VITALS — BP 146/92 | HR 84 | Temp 97.5°F | Resp 16 | Wt 125.5 lb

## 2022-08-15 DIAGNOSIS — G301 Alzheimer's disease with late onset: Secondary | ICD-10-CM

## 2022-08-15 DIAGNOSIS — K920 Hematemesis: Secondary | ICD-10-CM

## 2022-08-15 DIAGNOSIS — R1110 Vomiting, unspecified: Secondary | ICD-10-CM

## 2022-08-15 DIAGNOSIS — R29898 Other symptoms and signs involving the musculoskeletal system: Secondary | ICD-10-CM

## 2022-08-15 DIAGNOSIS — K5900 Constipation, unspecified: Secondary | ICD-10-CM

## 2022-08-15 NOTE — Progress Notes (Signed)
[facility] Consult Note Telephone: [phone]  Fax: [phone]   Date of encounter: 08/15/22 10:00 AM PATIENT NAME: Terri Green [address] [place] [address]   [phone] (home)  DOB: 1926-09-08 MRN: [id number] PRIMARY CARE PROVIDER:    Lauree Chandler, NP,  [place] [address] [phone]  REFERRING PROVIDER:   Lauree Chandler, NP [place],  [place] [address] [phone]  Health Care Agent/Health Care Power of Attorney:    Contact Information     Name Relation Home Work [place] Daughter [phone]  [phone]        I met face to face with patient in [facility] facility. Palliative Care was asked to follow this patient by consultation request of Lauree Chandler, NP to address advance care planning and complex medical decision making. Also spoke with Daughter [name] via phone to discuss POC and patient updates as appropriate. This is a follow up visit.    HEALTH CARE DECISION MAKER AND PHONE NUMBER: [name], daughter     CODE STATUS: MOST: DNR/DNI with comfort measures Use of antibiotics and IV fluids on a case by case, time limited basis No feeding tube.    ASSESSMENT AND / RECOMMENDATIONS:  PPS: 30% Severe Late Onset Dementia with behavioral disturbance-   FAST Score - 7C             Stable condition at this time.  Continue current medication             regimen of Seroquel and Zoloft.    Reorient as necessary.   Report any increased behaviors or agitation     Weakness Right Arm -   Acute condition returned to baseline.   Continue to monitor for worsening condition or change in              baseline status.    Provide PRN pain management as needed  Monitor for need for PT services for ROM with worsening             condition      Coffee Ground Emesis-   Acute condition resolved at  this time, No signs of acute              bleeding or hypovolemia. HGB improved from last draw to            10.1.   Continue to monitor for nausea/vomiting   Report any signs of bleeding   Monitor and report VS changes such as hypoxia,              tachycardia   Constipation -   Continue current bowel regimen of Senna-S and Miralax QD  Monitor for blood in stool/dark tarry stool   Encourage fluid intake frequently throughout the day   Report any recurrence N/V/D.     Follow up Palliative Care Visit:  Palliative Care continuing to follow up by monitoring for changes in appetite, weight, functional and cognitive status for chronic disease progression and management in agreement with patient's stated goals of care. Next visit in 2-3 weeks or prn.  This visit was coded based on medical decision making (MDM).  Chief Complaint  Family requested acute visit following ER visit stating pt now not moving right arm.  HISTORY OF PRESENT ILLNESS: Terri Green is a 89 y.o. year old female with dementia who had recent ED visit for nausea, vomiting and  coffee ground emesis. She states "help me.  I hurt."  When asking where she replies everywhere, asks to be put back to bed. Also states that she is nauseous and hungry.   Spoke with Daughter [name] via phone regarding patient status. She states that patient was sent to ER for decreased ROM/guarding and acute change in color to RUE at last in person visit. Requested visit to ensure that it was safe for patient to be OOB majority of day and to verify no further involvement or concerns with extremity. CG also reports that constipation is chronic and that patient seems to do better overall around more people throughout the day vs isolated in bed.   ACTIVITIES OF DAILY LIVING: CONTINENT OF BLADDER/BOWEL? No Dependent for bathing, dressing and feeding   MOBILITY:   WHEELCHAIR/BEDBOUND  APPETITE? Eats pureed foods, thin liquids (requires  cueing)  CURRENT PROBLEM LIST:  Patient Active Problem List   Diagnosis Date Noted   Wheelchair dependent 06/08/2022   Dysphagia 06/07/2022   Constipation 05/24/2019   Anemia 05/06/2018   Vitamin D deficiency 07/18/2016   Anxiety 06/21/2015   Allergic rhinitis due to pollen 10/19/2014   Dementia ([place]) with behaviors  09/07/2014   Poor appetite 09/07/2014   Pure hypercholesterolemia, unspecified 06/01/2014   Benign essential hypertension 06/01/2014   PAST MEDICAL HISTORY:  Active Ambulatory Problems    Diagnosis Date Noted   Constipation 05/24/2019   Anemia 05/06/2018   Anxiety 06/21/2015   Dementia ([place]) with behaviors  09/07/2014   Allergic rhinitis due to pollen 10/19/2014   Vitamin D deficiency 07/18/2016   Pure hypercholesterolemia, unspecified 06/01/2014   Benign essential hypertension 06/01/2014   Poor appetite 09/07/2014   Dysphagia 06/07/2022   Wheelchair dependent 06/08/2022   Resolved Ambulatory Problems    Diagnosis Date Noted   Fecal impaction ([place]) 05/24/2019   Past Medical History:  Diagnosis Date   Bradycardia    Dehydration    Unresponsive episode    SOCIAL HX:  Social History   Tobacco Use   Smoking status: Never   Smokeless tobacco: Never  Substance Use Topics   Alcohol use: Never   FAMILY HX: No family history on file.     Preferred Pharmacy: ALLERGIES:  Allergies  Allergen Reactions   Penicillins      PERTINENT MEDICATIONS:  Outpatient Encounter Medications as of 08/15/2022  Medication Sig   acetaminophen (TYLENOL) 325 MG tablet Take 1 tablet (325 mg total) by mouth every 6 (six) hours as needed. (Patient taking differently: Take 325-650 mg by mouth as directed. Mild pain 325 mg Q 6 hours prn, 650 mg po Q 8 hrs prn moderate-severe pain)   ALPRAZolam (XANAX) 0.25 MG tablet Take 1 tablet (0.25 mg total) by mouth daily as needed for anxiety.   guaiFENesin (ROBITUSSIN) 100 MG/5ML liquid Take 15 mLs by mouth every 8 (eight) hours as  needed for cough. cough and congestion   Melatonin 5 MG CHEW Chew 1 tablet by mouth at bedtime.   polyethylene glycol (MIRALAX / GLYCOLAX) 17 g packet Take 17 g by mouth daily.   QUEtiapine (SEROQUEL) 50 MG tablet TAKE 1 TABLET(50 MG) BY MOUTH AT BEDTIME   sennosides-docusate sodium (SENOKOT-S) 8.6-50 MG tablet Take 1 tablet by mouth daily.   sertraline (ZOLOFT) 100 MG tablet TAKE 2 TABLETS BY MOUTH DAILY   No facility-administered encounter medications on file as of 08/15/2022.    History obtained from review of EMR, discussion with family, facility staff/caregiver and/or patient.   CBC  Component Value Date/Time   WBC 7.2 08/10/2022 0335   RBC 3.65 (L) 08/10/2022 0335   HGB 10.1 (L) 08/10/2022 0335   HCT 32.5 (L) 08/10/2022 0335   PLT 230 08/10/2022 0335   MCV 89.0 08/10/2022 0335   MCH 27.7 08/10/2022 0335   MCHC 31.1 08/10/2022 0335   RDW 15.2 08/10/2022 0335   LYMPHSABS 0.8 08/10/2022 0335   MONOABS 0.8 08/10/2022 0335   EOSABS 0.1 08/10/2022 0335   BASOSABS 0.0 08/10/2022 0335    CMP    Latest Ref Rng & Units 08/10/2022    3:35 AM 07/23/2021    3:40 PM 04/22/2021    4:15 PM  CMP  Glucose 70 - 99 mg/dL 122  82  103   BUN 8 - 23 mg/dL 30  18  13    Creatinine 0.44 - 1.00 mg/dL 1.15  0.97  1.12   Sodium 135 - 145 mmol/L 137  138  134   Potassium 3.5 - 5.1 mmol/L 4.0  4.7  4.1   Chloride 98 - 111 mmol/L 102  101  100   CO2 22 - 32 mmol/L 26  24  27    Calcium 8.9 - 10.3 mg/dL 9.4  9.4  9.1   Total Protein 6.5 - 8.1 g/dL 7.8  7.5    Total Bilirubin 0.3 - 1.2 mg/dL 0.6  0.3    Alkaline Phos 38 - 126 U/L 61     AST 15 - 41 U/L 17  22    ALT 0 - 44 U/L 14  13      LFTs    Latest Ref Rng & Units 08/10/2022    3:35 AM 07/23/2021    3:40 PM  Hepatic Function  Total Protein 6.5 - 8.1 g/dL 7.8  7.5   Albumin 3.5 - 5.0 g/dL 4.0    AST 15 - 41 U/L 17  22   ALT 0 - 44 U/L 14  13   Alk Phosphatase 38 - 126 U/L 61    Total Bilirubin 0.3 - 1.2 mg/dL 0.6  0.3      I  reviewed available labs, medications, imaging, studies and related documents from the EMR.   Records reviewed and summarized above.   Physical Exam: GENERAL: NAD LUNGS: CTAB, no increased work of breathing, room air CARDIAC:  S1S2, RRR with no MRG, N edema, N cyanosis ABD:  Hypo-active BS x 4 quads, soft and non-tender, + nausea,  EXTREMITIES: Decreased ROM due to deconditioning, no deformity, grip strength equal 4/5, N muscle atrophy/subcutaneous fat loss, Moves R arm freely and no noted color changes to extremity  NEURO: Severe cognitive impairment, aphasia-expressive/receptive aphasia, Slight agitation intermittently  PSYCH:  minimally anxious affect, A & O to self, volunteers some response but unlikely that patient understands questioning.    Please call our main office at [phone] if we can be of additional assistance.    Damaris Hippo FNP-C  [email or web address] [facility]  Phone:  [phone]

## 2022-08-27 ENCOUNTER — Telehealth: Payer: Self-pay

## 2022-08-27 DIAGNOSIS — R1111 Vomiting without nausea: Secondary | ICD-10-CM

## 2022-08-27 NOTE — Telephone Encounter (Signed)
Incoming fax was received from [facility] over the weekend indicating patient throwing up a black substance. Lauree Chandler, NP addressed on Monday 08/26/22 and replied  1.) Please obtain vitals daily x 3 days 2.) CBCD and CMP today.  Incoming call received from [facility] stating they do not do blood draws and patient would need to get labs at our office.  Left message on voicemail for patients daughter, Terri Green to return call when available. Reason for call, patient needs to schedule a non fasting lab appointment. Order placed and released.

## 2022-08-30 ENCOUNTER — Encounter: Payer: Self-pay | Admitting: Family Medicine

## 2022-08-30 ENCOUNTER — Non-Acute Institutional Stay: Payer: Medicare Other | Admitting: Family Medicine

## 2022-08-30 VITALS — BP 128/76 | HR 72 | Temp 97.2°F | Resp 18

## 2022-08-30 DIAGNOSIS — K920 Hematemesis: Secondary | ICD-10-CM

## 2022-08-30 DIAGNOSIS — K5900 Constipation, unspecified: Secondary | ICD-10-CM

## 2022-08-30 DIAGNOSIS — R1110 Vomiting, unspecified: Secondary | ICD-10-CM

## 2022-08-30 NOTE — Progress Notes (Unsigned)
Therapist, nutritional Palliative Care Consult Note Telephone: [phone]  Fax: [phone]   Date of encounter: 08/30/22 2:08 PM PATIENT NAME: Terri Green [address] [place] [address]   [phone] (home)  DOB: 1927-02-27 MRN: [id number] PRIMARY CARE PROVIDER:    Sharon Seller, NP,  [address] [place]. [place] [address] [phone]  REFERRING PROVIDER:   Sharon Seller, NP [address] [place],  [place] [address] [phone]  Health Care Agent/Health Care Power of Attorney:    Contact Information     Name Relation Home Work [place] Daughter [phone]  [phone]        I met face to face with patient in [facility] facility. Palliative Care was asked to follow this patient by consultation request of Sharon Seller, NP to address advance care planning and complex medical decision making. Also spoke with Daughter [name] via phone to discuss POC and patient updates as appropriate. This is a follow up visit.    HEALTH CARE DECISION MAKER AND PHONE NUMBER: Nylaa Bullen, daughter     CODE STATUS: MOST: DNR/DNI with comfort measures Use of antibiotics and IV fluids on a case by case, time limited basis No feeding tube.    ASSESSMENT AND / RECOMMENDATIONS:  PPS: 30% Severe Late Onset Dementia with behavioral disturbance-   FAST Score - 7C             Stable condition at this time.  Continue current medication             regimen of Seroquel and Zoloft.    Reorient as necessary.   Report any increased behaviors or agitation     Weakness Right Arm -   Acute condition returned to baseline.   Continue to monitor for worsening condition or change in              baseline status.    Provide PRN pain management as needed  Monitor for need for PT services for ROM with worsening             condition      Coffee Ground Emesis-   Acute condition resolved at  this time, No signs of acute              bleeding or hypovolemia. HGB improved from last draw to            10.1.   Continue to monitor for nausea/vomiting   Report any signs of bleeding   Monitor and report VS changes such as hypoxia,              tachycardia   Constipation -   Continue current bowel regimen of Senna-S and Miralax QD  Monitor for blood in stool/dark tarry stool   Encourage fluid intake frequently throughout the day   Report any recurrence N/V/D.     Follow up Palliative Care Visit:  Palliative Care continuing to follow up by monitoring for changes in appetite, weight, functional and cognitive status for chronic disease progression and management in agreement with patient's stated goals of care. Next visit in 2-3 weeks or prn.  This visit was coded based on medical decision making (MDM).  Chief Complaint  Acute visit following with 2 recent c/o vomiting with most recent episode overnight.  HISTORY OF PRESENT ILLNESS: Terri Green is a 89 y.o. year old female with dementia who had recent ED visit for nausea, vomiting and coffee  ground emesis. Pt had another episode of emesis overnight that was "black", is not on any blood thinners but does take Sertraline.  She c/o pain but staff states she has complained of same since arrival and has no different behaviors. Pt answers questions but it is unclear how much she truly understands.  She has not had a BM in the last 2 days She has not had any significant medication changes.  Staff indicates she has Melatonin, Quietiapine, Sertraline, sennokot.  Denies any recent falls.  She continues to eat and drink meals without vomiting.  ACTIVITIES OF DAILY LIVING: CONTINENT OF BLADDER/BOWEL? No Dependent for bathing, dressing and feeding   MOBILITY:   WHEELCHAIR/BEDBOUND  APPETITE? Eats pureed foods, thin liquids (requires cueing)  CURRENT PROBLEM LIST:  Patient Active Problem List   Diagnosis Date Noted   Wheelchair  dependent 06/08/2022   Dysphagia 06/07/2022   Constipation 05/24/2019   Anemia 05/06/2018   Vitamin D deficiency 07/18/2016   Anxiety 06/21/2015   Allergic rhinitis due to pollen 10/19/2014   Dementia (HCC) with behaviors  09/07/2014   Poor appetite 09/07/2014   Pure hypercholesterolemia, unspecified 06/01/2014   Benign essential hypertension 06/01/2014   PAST MEDICAL HISTORY:  Active Ambulatory Problems    Diagnosis Date Noted   Constipation 05/24/2019   Anemia 05/06/2018   Anxiety 06/21/2015   Dementia (HCC) with behaviors  09/07/2014   Allergic rhinitis due to pollen 10/19/2014   Vitamin D deficiency 07/18/2016   Pure hypercholesterolemia, unspecified 06/01/2014   Benign essential hypertension 06/01/2014   Poor appetite 09/07/2014   Dysphagia 06/07/2022   Wheelchair dependent 06/08/2022   Resolved Ambulatory Problems    Diagnosis Date Noted   Fecal impaction 05/24/2019   Past Medical History:  Diagnosis Date   Bradycardia    Dehydration    Unresponsive episode    SOCIAL HX:  Social History   Tobacco Use   Smoking status: Never   Smokeless tobacco: Never  Substance Use Topics   Alcohol use: Never   FAMILY HX: No family history on file.     Preferred Pharmacy: ALLERGIES:  Allergies  Allergen Reactions   Penicillins      PERTINENT MEDICATIONS:  Outpatient Encounter Medications as of 08/30/2022  Medication Sig   Melatonin 5 MG CHEW Chew 1 tablet by mouth at bedtime.   QUEtiapine (SEROQUEL) 50 MG tablet TAKE 1 TABLET(50 MG) BY MOUTH AT BEDTIME   sennosides-docusate sodium (SENOKOT-S) 8.6-50 MG tablet Take 1 tablet by mouth daily.   sertraline (ZOLOFT) 100 MG tablet TAKE 2 TABLETS BY MOUTH DAILY   acetaminophen (TYLENOL) 325 MG tablet Take 1 tablet (325 mg total) by mouth every 6 (six) hours as needed. (Patient taking differently: Take 325-650 mg by mouth as directed. Mild pain 325 mg Q 6 hours prn, 650 mg po Q 8 hrs prn moderate-severe pain)   ALPRAZolam  (XANAX) 0.25 MG tablet Take 1 tablet (0.25 mg total) by mouth daily as needed for anxiety.   polyethylene glycol (MIRALAX / GLYCOLAX) 17 g packet Take 17 g by mouth daily. (Patient not taking: Reported on 08/30/2022)   [DISCONTINUED] guaiFENesin (ROBITUSSIN) 100 MG/5ML liquid Take 15 mLs by mouth every 8 (eight) hours as needed for cough. cough and congestion   No facility-administered encounter medications on file as of 08/30/2022.    History obtained from review of EMR, discussion with family, facility staff/caregiver and/or patient.      I reviewed available labs, medications, imaging, studies and related documents from the EMR.  There are no new records in the system.   Physical Exam: GENERAL: NAD, appears to be intermittently frowning and grimacing.  Withdraws when attempting O2 sats otherwise cooperative LUNGS: CTAB, no increased work of breathing, room air CARDIAC:  S1S2, RRR with no MRG, No edema/cyanosis ABD:  Normo-active BS x 4 quads, soft and non-tender, no rebound or guarding EXTREMITIES: No muscle atrophy/subcutaneous fat loss NEURO: Severe cognitive impairment, aphasia-expressive/receptive aphasia PSYCH:  non- anxious affect, A & O to self, volunteers some response.    Please call our main office at [phone] if we can be of additional assistance.    Joycelyn ManKaren Leotha Westermeyer FNP-C  [email or web address]  Phone:  [phone]

## 2022-09-01 DIAGNOSIS — K920 Hematemesis: Secondary | ICD-10-CM | POA: Insufficient documentation

## 2022-09-01 DIAGNOSIS — R1110 Vomiting, unspecified: Secondary | ICD-10-CM | POA: Insufficient documentation

## 2022-09-02 ENCOUNTER — Encounter: Payer: Self-pay | Admitting: Nurse Practitioner

## 2022-09-02 ENCOUNTER — Ambulatory Visit (INDEPENDENT_AMBULATORY_CARE_PROVIDER_SITE_OTHER): Payer: Medicare Other | Admitting: Nurse Practitioner

## 2022-09-02 VITALS — BP 118/68 | HR 67 | Temp 97.2°F | Resp 14

## 2022-09-02 DIAGNOSIS — G301 Alzheimer's disease with late onset: Secondary | ICD-10-CM

## 2022-09-02 DIAGNOSIS — R1111 Vomiting without nausea: Secondary | ICD-10-CM

## 2022-09-02 DIAGNOSIS — F419 Anxiety disorder, unspecified: Secondary | ICD-10-CM

## 2022-09-02 DIAGNOSIS — F02818 Dementia in other diseases classified elsewhere, unspecified severity, with other behavioral disturbance: Secondary | ICD-10-CM

## 2022-09-02 DIAGNOSIS — D649 Anemia, unspecified: Secondary | ICD-10-CM | POA: Diagnosis not present

## 2022-09-02 DIAGNOSIS — K5901 Slow transit constipation: Secondary | ICD-10-CM

## 2022-09-02 MED ORDER — QUETIAPINE FUMARATE 50 MG PO TABS: 50.0000 mg | ORAL_TABLET | Freq: Every day | ORAL | 1 refills | Status: DC

## 2022-09-02 MED ORDER — SERTRALINE HCL 100 MG PO TABS: 150.0000 mg | ORAL_TABLET | Freq: Every day | ORAL | 1 refills | Status: DC

## 2022-09-02 MED ORDER — PANTOPRAZOLE SODIUM 40 MG PO TBEC: 40.0000 mg | DELAYED_RELEASE_TABLET | Freq: Every day | ORAL | 3 refills | Status: DC

## 2022-09-02 MED ORDER — PANTOPRAZOLE SODIUM 40 MG PO PACK: 40.0000 mg | PACK | Freq: Every day | ORAL | 3 refills | Status: DC

## 2022-09-02 NOTE — Progress Notes (Signed)
Careteam: Patient Care Team: Terri Green, Iria Jamerson K, NP as PCP - General (Geriatric Medicine)  PLACE [facility] SERVICE[facility]  [facility]  Advanced Directive information    Allergies  Allergen Reactions   Penicillins     Chief Complaint  Patient presents with   Follow-up    No vomiting since 4/5. They have been unable to get her pantoprazole powder because they want the PCP to send in it.      HPI: Patient is a 89 y.o. female for follow up.   She went to the ED after episodes of vomiting dark emesis and then she had 2 other episodes at her assisted living. The last episode was on 08/30/22 which was 1 episode of vomiting. She is eating and drinking well since then. Staff at [facility] help assist her with meals.   She is followed by palliative care team who added protonix and carafate  She has not picked up protonix because it has not come though the pharmacy.   Daughter has not noticed any significant changes with her. No LOC, lethargy.  No chest pains.   She gets more anxious/agitated with dressing and bathing.  Palliative care following her for more of a comfort approach. She is no longer standing up.  Wheelchair bound.     Review of Systems:  Review of Systems  Unable to perform ROS: Dementia    Past Medical History:  Diagnosis Date   Bradycardia    Dehydration    Dementia    Unresponsive episode    History reviewed. No pertinent surgical history. Social History:   reports that she has never smoked. She has never used smokeless tobacco. She reports that she does not drink alcohol and does not use drugs.  History reviewed. No pertinent family history.  Medications: Patient's Medications  New Prescriptions   No medications on file  Previous Medications   ACETAMINOPHEN (TYLENOL) 325 MG TABLET    Take 1 tablet (325 mg total) by mouth every 6 (six) hours as needed.   ALPRAZOLAM (XANAX) 0.25 MG TABLET    Take 1 tablet (0.25 mg total) by mouth daily as needed for anxiety.    MELATONIN 5 MG CHEW    Chew 1 tablet by mouth at bedtime.   PANTOPRAZOLE SODIUM (PROTONIX) 40 MG    Take 40 mg by mouth 2 (two) times daily.   POLYETHYLENE GLYCOL (MIRALAX / GLYCOLAX) 17 G PACKET    Take 17 g by mouth daily.   QUETIAPINE (SEROQUEL) 50 MG TABLET    TAKE 1 TABLET(50 MG) BY MOUTH AT BEDTIME   SENNOSIDES-DOCUSATE SODIUM (SENOKOT-S) 8.6-50 MG TABLET    Take 1 tablet by mouth daily.   SERTRALINE (ZOLOFT) 100 MG TABLET    TAKE 2 TABLETS BY MOUTH DAILY   SUCRALFATE (CARAFATE) 1 GM/10ML SUSPENSION    Take 1 g by mouth 4 (four) times daily -  with meals and at bedtime.  Modified Medications   No medications on file  Discontinued Medications   No medications on file    Physical Exam:  Vitals:   09/02/22 1445  BP: 118/68  Pulse: 67  Resp: 14  Temp: (!) 97.2 F (36.2 C)  TempSrc: Temporal  SpO2: 97%   There is no height or weight on file to calculate BMI. Wt Readings from Last 3 Encounters:  08/15/22 125 lb 8 oz (56.9 kg)  01/30/22 126 lb 8 oz (57.4 kg)  09/05/21 127 lb 8 oz (57.8 kg)    Physical Exam  Constitutional:      General: She is not in acute distress.    Appearance: She is well-developed. She is not diaphoretic.     Comments: Pleasantly confused female. Smiles frequently during visit.   HENT:     Head: Normocephalic and atraumatic.     Mouth/Throat:     Pharynx: No oropharyngeal exudate.  Eyes:     Conjunctiva/sclera: Conjunctivae normal.     Pupils: Pupils are equal, round, and reactive to light.  Cardiovascular:     Rate and Rhythm: Normal rate and regular rhythm.     Heart sounds: Normal heart sounds.  Pulmonary:     Effort: Pulmonary effort is normal.     Breath sounds: Normal breath sounds.  Abdominal:     General: Bowel sounds are normal.     Palpations: Abdomen is soft.  Musculoskeletal:     Cervical back: Normal range of motion and neck supple.     Right lower leg: No edema.     Left lower leg: No edema.  Skin:    General: Skin is warm  and dry.  Neurological:     Mental Status: She is alert. Mental status is at baseline.     Motor: Weakness present.     Gait: Gait abnormal.  Psychiatric:        Mood and Affect: Mood normal.     Labs reviewed: Basic Metabolic Panel: Recent Labs    08/10/22 0335  NA 137  K 4.0  CL 102  CO2 26  GLUCOSE 122*  BUN 30*  CREATININE 1.15*  CALCIUM 9.4   Liver Function Tests: Recent Labs    08/10/22 0335  AST 17  ALT 14  ALKPHOS 61  BILITOT 0.6  PROT 7.8  ALBUMIN 4.0   No results for input(s): "LIPASE", "AMYLASE" in the last 8760 hours. No results for input(s): "AMMONIA" in the last 8760 hours. CBC: Recent Labs    08/10/22 0335  WBC 7.2  NEUTROABS 5.4  HGB 10.1*  HCT 32.5*  MCV 89.0  PLT 230   Lipid Panel: No results for input(s): "CHOL", "HDL", "LDLCALC", "TRIG", "CHOLHDL", "LDLDIRECT" in the last 8760 hours. TSH: No results for input(s): "TSH" in the last 8760 hours. A1C: No results found for: "HGBA1C"   Assessment/Plan 1. Vomiting without nausea, unspecified vomiting type Pt is 89 and family does not want aggressive measure or work up at this time. Will continue on protonix with carafate at this time for symptom management. Will follow up cbc to follow hgb at this time. - pantoprazole sodium (PROTONIX) 40 mg; Take 40 mg by mouth daily.  Dispense: 30 packet; Refill: 3  2. Late onset Alzheimer's disease with behavioral disturbance -Stable, no acute changes in cognitive or functional status, continue supportive care.  - Complete Metabolic Panel with eGFR - QUEtiapine (SEROQUEL) 50 MG tablet; Take 1 tablet (50 mg total) by mouth at bedtime.  Dispense: 90 tablet; Refill: 1  3. Anemia, unspecified type Had 3 episodes of dark tarry emesis, will follow up cbc and cmp at this time No episode in 3 days and eating well at this time.  - CBC with Differential/Platelet - Complete Metabolic Panel with eGFR  4. Anxiety -doing well without increase in anxiety,  will attempt GDR at this time and decrease zoloft to 150 mg daily  - Complete Metabolic Panel with eGFR - sertraline (ZOLOFT) 100 MG tablet; Take 1.5 tablets (150 mg total) by mouth daily.  Dispense: 90 tablet; Refill: 1  5.  Slow transit constipation -stable continues on miralax and senokot daily   Terri Green K. Biagio Borg [facility] [phone]

## 2022-09-03 LAB — COMPLETE METABOLIC PANEL WITH GFR
AG Ratio: 1.2 (calc) (ref 1.0–2.5)
ALT: 11 U/L (ref 6–29)
AST: 15 U/L (ref 10–35)
Albumin: 4.2 g/dL (ref 3.6–5.1)
Alkaline phosphatase (APISO): 85 U/L (ref 37–153)
BUN: 14 mg/dL (ref 7–25)
CO2: 24 mmol/L (ref 20–32)
Calcium: 9.5 mg/dL (ref 8.6–10.4)
Chloride: 104 mmol/L (ref 98–110)
Creat: 0.89 mg/dL (ref 0.60–0.95)
Globulin: 3.4 g/dL (calc) (ref 1.9–3.7)
Glucose, Bld: 92 mg/dL (ref 65–99)
Potassium: 4.5 mmol/L (ref 3.5–5.3)
Sodium: 140 mmol/L (ref 135–146)
Total Bilirubin: 0.2 mg/dL (ref 0.2–1.2)
Total Protein: 7.6 g/dL (ref 6.1–8.1)
eGFR: 60 mL/min/{1.73_m2} (ref >=60)

## 2022-09-03 LAB — CBC WITH DIFFERENTIAL/PLATELET
Absolute Monocytes: 636 cells/uL (ref 200–950)
Basophils Absolute: 42 cells/uL (ref 0–200)
Basophils Relative: 0.7 %
Eosinophils Absolute: 222 cells/uL (ref 15–500)
Eosinophils Relative: 3.7 %
HCT: 31.9 % — ABNORMAL LOW (ref 35.0–45.0)
Hemoglobin: 10.4 g/dL — ABNORMAL LOW (ref 11.7–15.5)
Lymphs Abs: 1248 cells/uL (ref 850–3900)
MCH: 27.7 pg (ref 27.0–33.0)
MCHC: 32.6 g/dL (ref 32.0–36.0)
MCV: 85.1 fL (ref 80.0–100.0)
MPV: 9.3 fL (ref 7.5–12.5)
Monocytes Relative: 10.6 %
Neutro Abs: 3852 cells/uL (ref 1500–7800)
Neutrophils Relative %: 64.2 %
Platelets: 299 10*3/uL (ref 140–400)
RBC: 3.75 10*6/uL — ABNORMAL LOW (ref 3.80–5.10)
RDW: 14.2 % (ref 11.0–15.0)
Total Lymphocyte: 20.8 %
WBC: 6 10*3/uL (ref 3.8–10.8)

## 2022-09-18 ENCOUNTER — Telehealth: Payer: Self-pay

## 2022-09-18 NOTE — Telephone Encounter (Signed)
Okay to hold, can take omeprazole 40 mg until able to get protonix (this is over the counter)

## 2022-09-18 NOTE — Telephone Encounter (Signed)
Naja with [facility] called needing an order to hold protonix for 6 days because insurance will not pay for it until the 30th. She would like order to hold until May 1st.  Message routed to [facility], NP

## 2022-09-19 NOTE — Telephone Encounter (Signed)
Order faxed to [facility]

## 2022-10-15 ENCOUNTER — Encounter: Payer: Self-pay | Admitting: Family Medicine

## 2022-10-15 ENCOUNTER — Encounter: Payer: Self-pay | Admitting: Nurse Practitioner

## 2022-10-15 ENCOUNTER — Non-Acute Institutional Stay: Payer: Medicare Other | Admitting: Family Medicine

## 2022-10-15 VITALS — BP 142/60 | HR 66 | Temp 97.7°F | Resp 14

## 2022-10-15 DIAGNOSIS — K5900 Constipation, unspecified: Secondary | ICD-10-CM

## 2022-10-15 DIAGNOSIS — K920 Hematemesis: Secondary | ICD-10-CM

## 2022-10-15 DIAGNOSIS — R1110 Vomiting, unspecified: Secondary | ICD-10-CM

## 2022-10-15 NOTE — Progress Notes (Addendum)
Therapist, nutritional Palliative Care Consult Note Telephone: [phone]  Fax: [phone]   Date of encounter: 10/15/22 1:15 PM PATIENT NAME: Terri Green [address] [place] [address]   [phone] (home)  DOB: 28-Jan-1927 MRN: [id number] PRIMARY CARE PROVIDER:    Sharon Seller, NP,  [address] [place]. [place] [address] [phone]  REFERRING PROVIDER:   Sharon Seller, NP [address] [place],  [place] [address] [phone]  Health Care Agent/Health Care Power of Attorney:    Contact Information     Name Relation Home Work [name] [phone]  [phone]        I met face to face with patient in [facility] facility. Palliative Care was asked to follow this patient by consultation request of Terri Seller, NP to address advance care planning and complex medical decision making. Also spoke with Daughter Terri Green via phone to discuss POC and patient updates as appropriate. This is a follow up visit.    HEALTH CARE DECISION MAKER AND PHONE NUMBER: Terri Green, daughter     CODE STATUS: MOST: DNR/DNI with comfort measures Use of antibiotics and IV fluids on a case by case, time limited basis No feeding tube.    ASSESSMENT AND / RECOMMENDATIONS:  PPS: 30%   Acute vomiting/Coffee Ground Emesis-              Resolved.  HGB stable at 10.4, VSS.             No signs of hypovolemia.   Monitor for any signs of new bleeding   Notify PCP if develops tachycardia or hypoxia             Pantoprazole 40 mg has been changed to               daily.             Sucralfate suspension d/c'd yesterday per              PCP and I agree.          2.    Constipation -              Bowel regimen effective  Continue Senna-S and Miralax QD  Report any recurrence N/V/D, bright red or             Dark, tarry stools.    Follow up Palliative Care Visit:  Palliative  Care continuing to follow up by monitoring for changes in appetite, weight, functional and cognitive status for chronic disease progression and management in agreement with patient's stated goals of care. Next visit in 4 weeks or prn.  This visit was coded based on medical decision making (MDM).  Chief Complaint  Palliative Care is continuing to follow pt for chronic medical management in setting of dementia with recent GI bleed.  HISTORY OF PRESENT ILLNESS: Emonnie Green is a 89 y.o. year old female with dementia. Prior to last visit she had an ED visit for hematemesis and black stool.  She c/o pain  and indicates right lower abdomen close to pelvis. Pt states she wants to die but cannot indicate why. Facility staff state she has been talking about wanting to die, that she is dying or hungry since the day she was admitted to the facility.  State her appetite is good, no falls and no medication changes. Staff denies further vomiting, hematemesis or black stools and states she  is having regular Bms.   ACTIVITIES OF DAILY LIVING: CONTINENT OF BLADDER/BOWEL? No Dependent for bathing, dressing and feeding   MOBILITY:   WHEELCHAIR/BEDBOUND  APPETITE? Eats pureed foods, thin liquids (requires cueing)  Weight:  126.5 lbs as of May 2024  CURRENT PROBLEM LIST:  Patient Active Problem List   Diagnosis Date Noted   Wheelchair dependent 06/08/2022   Dysphagia 06/07/2022   Constipation 05/24/2019   Anemia 05/06/2018   Vitamin D deficiency 07/18/2016   Anxiety 06/21/2015   Allergic rhinitis due to pollen 10/19/2014   Dementia (HCC) with behaviors  09/07/2014   Poor appetite 09/07/2014   Pure hypercholesterolemia, unspecified 06/01/2014   Benign essential hypertension 06/01/2014   PAST MEDICAL HISTORY:  Active Ambulatory Problems    Diagnosis Date Noted   Constipation 05/24/2019   Anemia 05/06/2018   Anxiety 06/21/2015   Dementia (HCC) with behaviors  09/07/2014   Allergic rhinitis  due to pollen 10/19/2014   Vitamin D deficiency 07/18/2016   Pure hypercholesterolemia, unspecified 06/01/2014   Benign essential hypertension 06/01/2014   Poor appetite 09/07/2014   Dysphagia 06/07/2022   Wheelchair dependent 06/08/2022   Resolved Ambulatory Problems    Diagnosis Date Noted   Fecal impaction 05/24/2019   Past Medical History:  Diagnosis Date   Bradycardia    Dehydration    Unresponsive episode    SOCIAL HX:  Social History   Tobacco Use   Smoking status: Never   Smokeless tobacco: Never  Substance Use Topics   Alcohol use: Never   FAMILY HX: No family history on file.     Preferred Pharmacy: ALLERGIES:  Allergies  Allergen Reactions   Penicillins      PERTINENT MEDICATIONS:  Outpatient Encounter Medications as of 10/15/2022  Medication Sig   acetaminophen (TYLENOL) 325 MG tablet Take 1 tablet (325 mg total) by mouth every 6 (six) hours as needed. (Patient taking differently: Take 325-650 mg by mouth as directed. Mild pain 325 mg Q 6 hours prn, 650 mg po Q 8 hrs prn moderate-severe pain)   ALPRAZolam (XANAX) 0.25 MG tablet Take 1 tablet (0.25 mg total) by mouth daily as needed for anxiety.   Melatonin 5 MG CHEW Chew 1 tablet by mouth at bedtime.   pantoprazole sodium (PROTONIX) 40 mg Take 40 mg by mouth daily.   polyethylene glycol (MIRALAX / GLYCOLAX) 17 g packet Take 17 g by mouth daily.   QUEtiapine (SEROQUEL) 50 MG tablet Take 1 tablet (50 mg total) by mouth at bedtime.   sennosides-docusate sodium (SENOKOT-S) 8.6-50 MG tablet Take 1 tablet by mouth daily.   sertraline (ZOLOFT) 100 MG tablet Take 1.5 tablets (150 mg total) by mouth daily.   [DISCONTINUED] sucralfate (CARAFATE) 1 GM/10ML suspension Take 1 g by mouth 4 (four) times daily -  with meals and at bedtime.   No facility-administered encounter medications on file as of 10/15/2022.    History obtained from review of EMR, discussion with family, facility staff/caregiver and/or patient.    CMP 09/02/22:         Component Ref Range & Units 1 mo ago (09/02/22) 2 mo ago (08/10/22) 1 yr ago (07/23/21) 1 yr ago (04/22/21) 2 yr ago (02/01/20) 2 yr ago (01/18/20)  Glucose, Bld 65 - 99 mg/dL 92 782 High  R, CM 82 R, CM 103 High  R, CM 103 R, CM 77 R, CM  Comment: .            Fasting reference interval .  BUN 7 -  25 mg/dL 14 30 High  R 18 13 R 14 14  Creat 0.60 - 0.95 mg/dL 5.40 9.81 High  R 1.91 High  1.12 High  R 1.08 High  R, CM 1.00 High  R, CM  eGFR > OR = 60 mL/min/1.48m2 60  55 Low  CM     BUN/Creatinine Ratio 6 - 22 (calc) SEE NOTE:  19  13 14   Comment:    Not Reported: BUN and Creatinine are within    reference range. .  Sodium 135 - 146 mmol/L 140 137 R 138 134 Low  R 136 133 Low   Potassium 3.5 - 5.3 mmol/L 4.5 4.0 R 4.7 4.1 R 4.3 5.6 High   Chloride 98 - 110 mmol/L 104 102 R 101 100 R 97 Low  97 Low   CO2 20 - 32 mmol/L 24 26 R 24 27 R 30 29  Calcium 8.6 - 10.4 mg/dL 9.5 9.4 R 9.4 9.1 R 47.8 9.7  Total Protein 6.1 - 8.1 g/dL 7.6 7.8 R 7.5     Albumin 3.6 - 5.1 g/dL 4.2  4.2     Globulin 1.9 - 3.7 g/dL (calc) 3.4  3.3     AG Ratio 1.0 - 2.5 (calc) 1.2  1.3     Total Bilirubin 0.2 - 1.2 mg/dL 0.2 0.6 R 0.3     Alkaline phosphatase (APISO) 37 - 153 U/L 85  71     AST 10 - 35 U/L 15 17 R 22     ALT 6 - 29 U/L 11 14 R 13      CBC 09/02/22: Component Ref Range & Units 1 mo ago (09/02/22) 2 mo ago (08/10/22) 1 yr ago (07/23/21) 1 yr ago (04/22/21)  WBC 3.8 - 10.8 Thousand/uL 6.0 7.2 R 4.4 5.3 R  RBC 3.80 - 5.10 Million/uL 3.75 Low  3.65 Low  R 3.76 Low  3.34 Low  R  Hemoglobin 11.7 - 15.5 g/dL 29.5 Low  62.1 Low  R 30.8 Low  9.4 Low  R  HCT 35.0 - 45.0 % 31.9 Low  32.5 Low  R 33.1 Low  31.4 Low  R  MCV 80.0 - 100.0 fL 85.1 89.0 88.0 94.0  MCH 27.0 - 33.0 pg 27.7 27.7 R 28.7 28.1 R  MCHC 32.0 - 36.0 g/dL 65.7 84.6 R 96.2 95.2 Low  R  RDW 11.0 - 15.0 % 14.2 15.2 R 13.7 14.9 R  Platelets 140 - 400 Thousand/uL 299 230 R 274 215 R  MPV 7.5 -  12.5 fL 9.3  9.3   Neutro Abs 1,500 - 7,800 cells/uL 3,852 5.4 R 2,350 3.4 R  Lymphs Abs 850 - 3,900 cells/uL 1,248 0.8 R 1,245 1.0 R  Absolute Monocytes 200 - 950 cells/uL 636  612   Eosinophils Absolute 15 - 500 cells/uL 222 0.1 R 163 0.1 R  Basophils Absolute 0 - 200 cells/uL 42 0.0 R 31 0.0 R  Neutrophils Relative % % 64.2 76 53.4 63  Total Lymphocyte % 20.8  28.3   Monocytes Relative % 10.6 11 13.9 14  Eosinophils Relative % 3.7 2 3.7 2  Basophils Relative % 0.7 0 0.7 1  nRBC  0.0 R  0.0 R  Lymphocytes Relative  11 R  19 R  Monocytes Absolute  0.8 R  0.7 R  Immature Granulocytes  0 R  1 R  Abs Immature Granulocytes  0.02 R, CM  0.04 R, CM     I reviewed available  labs, medications, imaging, studies and related documents from the EMR.   Records reviewed and summarized above.   Physical Exam: GENERAL: NAD, will open mouth anytime close to head.  Withdraws when attempting O2 sats otherwise cooperative LUNGS: CTAB, no increased work of breathing, room air CARDIAC:  S1S2, RRR with LUSB murmur, No edema/cyanosis ABD:  Normo-active BS x 4 quads, soft and non-tender, no rebound or guarding EXTREMITIES: No muscle atrophy/subcutaneous fat loss NEURO: Severe cognitive impairment, aphasia-expressive/receptive aphasia PSYCH:  non- anxious affect, A & O to self, volunteers some response.    Please call our main office at [phone] if we can be of additional assistance.    Joycelyn Man FNP-C  [email or web address] .[facility]  Phone:  [phone]

## 2022-11-18 ENCOUNTER — Encounter: Payer: Self-pay | Admitting: Nurse Practitioner

## 2022-11-20 ENCOUNTER — Other Ambulatory Visit: Payer: Self-pay | Admitting: Nurse Practitioner

## 2022-11-20 DIAGNOSIS — F02818 Dementia in other diseases classified elsewhere, unspecified severity, with other behavioral disturbance: Secondary | ICD-10-CM

## 2022-11-22 ENCOUNTER — Other Ambulatory Visit: Payer: Self-pay | Admitting: Nurse Practitioner

## 2022-11-22 MED ORDER — OMEPRAZOLE 40 MG PO CPDR: 40.0000 mg | DELAYED_RELEASE_CAPSULE | Freq: Every day | ORAL | 0 refills | Status: DC

## 2022-11-22 NOTE — Progress Notes (Unsigned)
Printed Rx.

## 2022-12-01 ENCOUNTER — Encounter: Payer: Self-pay | Admitting: Nurse Practitioner

## 2022-12-17 ENCOUNTER — Encounter: Payer: Self-pay | Admitting: Nurse Practitioner

## 2022-12-18 ENCOUNTER — Encounter: Payer: Self-pay | Admitting: Adult Health

## 2022-12-18 ENCOUNTER — Telehealth (INDEPENDENT_AMBULATORY_CARE_PROVIDER_SITE_OTHER): Payer: Medicare Other | Admitting: Adult Health

## 2022-12-18 DIAGNOSIS — F339 Major depressive disorder, recurrent, unspecified: Secondary | ICD-10-CM

## 2022-12-18 DIAGNOSIS — F02818 Dementia in other diseases classified elsewhere, unspecified severity, with other behavioral disturbance: Secondary | ICD-10-CM

## 2022-12-18 DIAGNOSIS — R21 Rash and other nonspecific skin eruption: Secondary | ICD-10-CM

## 2022-12-18 DIAGNOSIS — G301 Alzheimer's disease with late onset: Secondary | ICD-10-CM | POA: Diagnosis not present

## 2022-12-18 DIAGNOSIS — F419 Anxiety disorder, unspecified: Secondary | ICD-10-CM

## 2022-12-18 NOTE — Progress Notes (Signed)
This service is provided via telemedicine  No vital signs collected/recorded due to the encounter was a telemedicine visit.   Location of patient (ex: home, work):  Home   Patient consents to a video visit:  Yes, 04/24/2021   Location of the provider (ex: office, home):  [facility]  Name of any referring provider:  Sharon Seller, NP   Names of all persons participating in the telemedicine service and their role in the encounter: Bethany B/CMA, Kenard Gower, NP , and patient  Time spent on call:  11 minutes    Patient was unable to self-report vital signs due to lack of equipment at home via telehealth      DATE:  12/18/2022 MRN:  [id number]  BIRTHDAY: 12/02/1926   Contact Information   None on File    Other Contacts     Name Relation Home Work [place] Daughter [phone]  [phone]        Code Status History     Date Active Date Inactive Code Status Order ID Comments User Context   12/23/2018 1338 04/27/2020 1625 DNR [id number]  Sharon Seller, NP Outpatient    Questions for Most Recent Historical Code Status (Order [id number])     Question Answer   In the event of cardiac or respiratory ARREST Do not call a "code blue"   In the event of cardiac or respiratory ARREST Do not perform Intubation, CPR, defibrillation or ACLS   In the event of cardiac or respiratory ARREST Use medication by any route, position, wound care, and other measures to relive pain and suffering. May use oxygen, suction and manual treatment of airway obstruction as needed for comfort.                Advance Directive Documentation    Flowsheet Row Most Recent Value  Type of Advance Directive Healthcare Power of Attorney, Living will, Out of facility DNR (pink MOST or yellow form)  Pre-existing out of facility DNR order (yellow form or pink MOST form) Yellow form placed in chart (order not valid for inpatient use), Pink MOST  form placed in chart (order not valid for inpatient use)  "MOST" Form in Place? --        Chief Complaint  Patient presents with   Acute Visit    Right leg redness    HISTORY OF PRESENT ILLNESS: This is a 89 year old female who had a video visit today with her daughter beside her. She was noted to have 2 rashes on her right lower leg. She lives at [facility]. Daughter was notified of the rashes 2 days ago by the staff. Daughter applied Neosporin ointment to the rashes last night. The rashes today were noted to be dry and erythema improved. She denies chills nor fever. Daughter thinks that rashes looks like from a bite of an insect.Patient does not complain of itching to the area. Daughter stated that she  noticed that another resident at the facility has a cat and patient passes by in the room. Daughter thought that it was [employer] that nobody can have a cat so she will talk to the director about it.    PAST MEDICAL HISTORY:  Past Medical History:  Diagnosis Date   Bradycardia    Dehydration    Dementia (HCC)    Unresponsive episode      CURRENT MEDICATIONS: Reviewed  Patient's Medications  New Prescriptions   No medications on file  Previous Medications   ACETAMINOPHEN (TYLENOL) 325 MG TABLET    Take 1 tablet (325 mg total) by mouth every 6 (six) hours as needed.   ALPRAZOLAM (XANAX) 0.25 MG TABLET    Take 1 tablet (0.25 mg total) by mouth daily as needed for anxiety.   MELATONIN 5 MG CHEW    Chew 1 tablet by mouth at bedtime.   OMEPRAZOLE (PRILOSEC) 40 MG CAPSULE    Take 1 capsule (40 mg total) by mouth daily.   PANTOPRAZOLE SODIUM (PROTONIX) 40 MG    Take 40 mg by mouth daily.   POLYETHYLENE GLYCOL (MIRALAX / GLYCOLAX) 17 G PACKET    Take 17 g by mouth daily.   QUETIAPINE (SEROQUEL) 50 MG TABLET    TAKE 1 TABLET(50 MG) BY MOUTH AT BEDTIME   SENNOSIDES-DOCUSATE SODIUM (SENOKOT-S) 8.6-50 MG TABLET    Take 1 tablet by mouth daily.   SERTRALINE (ZOLOFT)  100 MG TABLET    Take 1.5 tablets (150 mg total) by mouth daily.  Modified Medications   No medications on file  Discontinued Medications   No medications on file     Allergies  Allergen Reactions   Penicillins      REVIEW OF SYSTEMS:  Daughter was the one giving the history due to patient having Alzheimer's disease.  GENERAL: no change in appetite, no fatigue, no weight changes, no fever, chills or weakness SKIN: 2 rashes on her right lower leg, dry EYES: Denies change in vision, dry eyes, eye pain, itching or discharge EARS: Denies change in hearing, ringing in ears, or earache NOSE: Denies nasal congestion or epistaxis MOUTH and THROAT: Denies oral discomfort, gingival pain or bleeding, pain from teeth or hoarseness   RESPIRATORY: no cough, SOB, DOE, wheezing, hemoptysis CARDIAC: no chest pain, edema or palpitations GI: no abdominal pain, diarrhea, constipation, heart burn, nausea or vomiting GU: Denies dysuria, frequency, hematuria or discharge PSYCHIATRIC: Denies feeling of depression or anxiety. No report of hallucinations, insomnia, paranoia, or agitation   LABS/RADIOLOGY: Labs reviewed: Basic Metabolic Panel: Recent Labs    08/10/22 0335 09/02/22 1520  NA 137 140  K 4.0 4.5  CL 102 104  CO2 26 24  GLUCOSE 122* 92  BUN 30* 14  CREATININE 1.15* 0.89  CALCIUM 9.4 9.5   Liver Function Tests: Recent Labs    08/10/22 0335 09/02/22 1520  AST 17 15  ALT 14 11  ALKPHOS 61  --   BILITOT 0.6 0.2  PROT 7.8 7.6  ALBUMIN 4.0  --    No results for input(s): "LIPASE", "AMYLASE" in the last 8760 hours. No results for input(s): "AMMONIA" in the last 8760 hours. CBC: Recent Labs    08/10/22 0335 09/02/22 1520  WBC 7.2 6.0  NEUTROABS 5.4 3,852  HGB 10.1* 10.4*  HCT 32.5* 31.9*  MCV 89.0 85.1  PLT 230 299   A1C: Invalid input(s): "A1C" Lipid Panel: No results for input(s): "HDL" in the last 8760 hours.  Invalid input(s): "LDL", "TRIGLYCERIDE",  "TOTALCHOL" Cardiac Enzymes: No results for input(s): "CKTOTAL", "CKMB", "CKMBINDEX", "TROPONINI" in the last 8760 hours. BNP: Invalid input(s): "POCBNP" CBG: Recent Labs    08/10/22 0322  GLUCAP 92      No results found.  ASSESSMENT/PLAN:  1. Rash -  keep area clean and dry -  continue application of Neosporin topically to rashes BID  2. Anxiety -  mood is stable -  continue Alprazolam PRN  3. Major depression, recurrent, chronic (HCC) -  mood is stable -  continue Zoloft and Seroquel  4. Late onset Alzheimer's disease with behavioral disturbance (HCC) -  no reported agitation -  lives at [facility] -  continue supportive care      Time spent on non face to face visit:   8 minutes  The patient gave consent to this video visit. Explained to the patient the risk and privacy issue that was involved with this video call.   The patient was advised to call back and ask for an in-person evaluation if the symptoms worsen or if the condition fails to improve.   Kenard Gower, NP [employer] [phone]

## 2022-12-19 ENCOUNTER — Other Ambulatory Visit: Payer: Self-pay | Admitting: Adult Health

## 2022-12-19 DIAGNOSIS — R21 Rash and other nonspecific skin eruption: Secondary | ICD-10-CM

## 2022-12-19 MED ORDER — HYDROCORTISONE 0.5 % EX OINT: 1.0000 | TOPICAL_OINTMENT | Freq: Two times a day (BID) | CUTANEOUS | 0 refills | Status: AC | PRN

## 2023-01-22 ENCOUNTER — Other Ambulatory Visit: Payer: Self-pay | Admitting: Nurse Practitioner

## 2023-01-22 DIAGNOSIS — R1111 Vomiting without nausea: Secondary | ICD-10-CM

## 2023-01-22 NOTE — Telephone Encounter (Signed)
High risk or very high risk warning populated when attempting to refill medication. RX request sent to PCP for review and approval if warranted.

## 2023-03-01 ENCOUNTER — Other Ambulatory Visit: Payer: Self-pay | Admitting: Nurse Practitioner

## 2023-03-01 DIAGNOSIS — F02818 Dementia in other diseases classified elsewhere, unspecified severity, with other behavioral disturbance: Secondary | ICD-10-CM

## 2023-03-03 ENCOUNTER — Other Ambulatory Visit: Payer: Self-pay | Admitting: Nurse Practitioner

## 2023-03-03 DIAGNOSIS — F419 Anxiety disorder, unspecified: Secondary | ICD-10-CM

## 2023-03-03 MED ORDER — SERTRALINE HCL 100 MG PO TABS: 150.0000 mg | ORAL_TABLET | Freq: Every day | ORAL | 1 refills | Status: AC

## 2023-03-25 ENCOUNTER — Telehealth: Payer: Medicare Other

## 2023-03-25 NOTE — Telephone Encounter (Signed)
I left a detailed message for [place] with Jessica's response

## 2023-03-25 NOTE — Telephone Encounter (Signed)
Incoming call received from nurse with [facility] requesting verbal orders for A & D ointment for ongoing rash. Terri Green is aware that I will consult with Sharon Seller, NP to confirm no other course of action required prior to giving verbal orders.  Please advise, last OV (video visit) 12/18/22

## 2023-03-25 NOTE — Telephone Encounter (Signed)
Okay to use but if does not improve after 1 week or worsens needs to make follow up appt

## 2023-04-03 ENCOUNTER — Telehealth: Payer: Self-pay

## 2023-04-03 ENCOUNTER — Other Ambulatory Visit: Payer: Self-pay

## 2023-04-03 NOTE — Progress Notes (Signed)
Medications discontinued on 03/23/2022 

## 2023-04-03 NOTE — Telephone Encounter (Signed)
Medication has been taken off the Patient chart. New medication was faxed to [facility].

## 2023-04-03 NOTE — Telephone Encounter (Signed)
If no longer taking okay to remove from medication list and notify pharmacy she is not taking. Thank you

## 2023-04-03 NOTE — Telephone Encounter (Signed)
Terri Green from [facility] is calling because the patient needed a refill on Pantoprazole Sodium 40mg . Patient daughter told her that her mom is no longer taking that medication. She needs clarification on whether or not to discontinue the prescription. She can be reached at [phone] or Fax the new med sheet to [phone]

## 2023-04-27 ENCOUNTER — Emergency Department (HOSPITAL_COMMUNITY): Payer: Medicare Other

## 2023-04-27 ENCOUNTER — Other Ambulatory Visit: Payer: Self-pay

## 2023-04-27 ENCOUNTER — Emergency Department (HOSPITAL_COMMUNITY)
Admission: EM | Admit: 2023-04-27 | Discharge: 2023-04-28 | Disposition: A | Discharge status: Home / Self Care | Payer: Medicare Other | Attending: Emergency Medicine | Admitting: Emergency Medicine

## 2023-04-27 ENCOUNTER — Encounter (HOSPITAL_COMMUNITY): Payer: Self-pay | Admitting: Emergency Medicine

## 2023-04-27 DIAGNOSIS — F0390 Unspecified dementia without behavioral disturbance: Secondary | ICD-10-CM | POA: Diagnosis not present

## 2023-04-27 DIAGNOSIS — I10 Essential (primary) hypertension: Secondary | ICD-10-CM | POA: Diagnosis not present

## 2023-04-27 DIAGNOSIS — R1110 Vomiting, unspecified: Secondary | ICD-10-CM | POA: Insufficient documentation

## 2023-04-27 DIAGNOSIS — W19XXXA Unspecified fall, initial encounter: Secondary | ICD-10-CM | POA: Diagnosis not present

## 2023-04-27 LAB — CBC WITH DIFFERENTIAL/PLATELET
Abs Immature Granulocytes: 0.02 10*3/uL (ref 0.00–0.07)
Basophils Absolute: 0 10*3/uL (ref 0.0–0.1)
Basophils Relative: 1 %
Eosinophils Absolute: 0.3 10*3/uL (ref 0.0–0.5)
Eosinophils Relative: 5 %
HCT: 36.5 % (ref 36.0–46.0)
Hemoglobin: 11.1 g/dL — ABNORMAL LOW (ref 12.0–15.0)
Immature Granulocytes: 0 %
Lymphocytes Relative: 29 %
Lymphs Abs: 1.6 10*3/uL (ref 0.7–4.0)
MCH: 28.2 pg (ref 26.0–34.0)
MCHC: 30.4 g/dL (ref 30.0–36.0)
MCV: 92.9 fL (ref 80.0–100.0)
Monocytes Absolute: 0.8 10*3/uL (ref 0.1–1.0)
Monocytes Relative: 15 %
Neutro Abs: 2.9 10*3/uL (ref 1.7–7.7)
Neutrophils Relative %: 50 %
Platelets: 239 10*3/uL (ref 150–400)
RBC: 3.93 MIL/uL (ref 3.87–5.11)
RDW: 14.1 % (ref 11.5–15.5)
WBC: 5.6 10*3/uL (ref 4.0–10.5)
nRBC: 0 % (ref 0.0–0.2)

## 2023-04-27 NOTE — ED Triage Notes (Signed)
Pt arrived via [facility] from [place]. Staff found patient on floor in the activity room. Assumed unwitnessed fall. Unknown LOC, unknown if she hit her head, no injures to head noted by EMS. Does not take blood thinners, per facility patient is alert but not oriented at baseline and is acting to her baseline now. Pt recently stopped taking protonix and has had vomiting prior to the fall.

## 2023-04-27 NOTE — ED Provider Notes (Signed)
[place] EMERGENCY DEPARTMENT AT [facility] Provider Note   CSN: [id number] Arrival date & time: 04/27/23  2236     History {Add pertinent medical, surgical, social history, OB history to HPI:1} Chief Complaint  Patient presents with   Terri Green    Jezelle Partipilo is a 89 y.o. female.  The history is provided by the patient and medical records.  Fall   Level 5 caveat: Dementia  88 year old female with history of anemia, anxiety, hypertension, dementia, hypercholesterolemia, presenting to the ED after unwitnessed fall.  Patient is resident at [facility] facility, found on the floor of the activity room by staff.  This was unwitnessed.  Unclear if head injury or loss of consciousness.  She is not currently on anticoagulation.  Patient is not able to provide any history at this time.  Facility did report she had vomiting prior to fall.  She does have a longstanding history of intermittent vomiting.  Has recently stopped her Protonix.  Home Medications Prior to Admission medications   Medication Sig Start Date End Date Taking? Authorizing Provider  acetaminophen (TYLENOL) 325 MG tablet Take 1 tablet (325 mg total) by mouth every 6 (six) hours as needed. Patient taking differently: Take 325-650 mg by mouth as directed. Mild pain 325 mg Q 6 hours prn, 650 mg po Q 8 hrs prn moderate-severe pain 06/19/20   Sharon Seller, NP  ALPRAZolam Prudy Feeler) 0.25 MG tablet Take 1 tablet (0.25 mg total) by mouth daily as needed for anxiety. 12/13/20   Ngetich, Dinah C, NP  hydrocortisone ointment 0.5 % Apply 1 Application topically 2 (two) times daily as needed for itching. 12/19/22   Medina-Vargas, Monina C, NP  Melatonin 5 MG CHEW Chew 1 tablet by mouth at bedtime.    [provider]  omeprazole (PRILOSEC) 40 MG capsule Take 1 capsule (40 mg total) by mouth daily. 11/22/22   Sharon Seller, NP  polyethylene glycol (MIRALAX / GLYCOLAX) 17 g packet Take 17 g by mouth daily.     [provider]  QUEtiapine (SEROQUEL) 50 MG tablet TAKE 1 TABLET(50 MG) BY MOUTH AT BEDTIME 03/03/23   Sharon Seller, NP  sennosides-docusate sodium (SENOKOT-S) 8.6-50 MG tablet Take 1 tablet by mouth daily.    [provider]  sertraline (ZOLOFT) 100 MG tablet Take 1.5 tablets (150 mg total) by mouth daily. 03/03/23   Sharon Seller, NP      Allergies    Penicillins    Review of Systems   Review of Systems  Unable to perform ROS: Other    Physical Exam Updated Vital Signs BP (!) 160/79   Pulse 83   Temp (!) 97.2 F (36.2 C) (Axillary)   Resp 20   SpO2 100%   Physical Exam Vitals and nursing note reviewed.  Constitutional:      Appearance: She is well-developed.  HENT:     Head: Normocephalic and atraumatic.     Comments: No visible head trauma Eyes:     Conjunctiva/sclera: Conjunctivae normal.     Pupils: Pupils are equal, round, and reactive to light.  Cardiovascular:     Rate and Rhythm: Normal rate and regular rhythm.     Heart sounds: Normal heart sounds.  Pulmonary:     Effort: Pulmonary effort is normal.     Breath sounds: Normal breath sounds.  Abdominal:     General: Bowel sounds are normal.     Palpations: Abdomen is soft.  Musculoskeletal:  General: Normal range of motion.     Cervical back: Normal range of motion.  Skin:    General: Skin is warm and dry.  Neurological:     Mental Status: She is alert and oriented to person, place, and time.     ED Results / Procedures / Treatments   Labs (all labs ordered are listed, but only abnormal results are displayed) Labs Reviewed  CBC WITH DIFFERENTIAL/PLATELET  COMPREHENSIVE METABOLIC PANEL  LIPASE, BLOOD  URINALYSIS, W/ REFLEX TO CULTURE (INFECTION SUSPECTED)    EKG None  Radiology No results found.  Procedures Procedures  {Document cardiac monitor, telemetry assessment procedure when appropriate:1}  Medications Ordered in ED Medications - No data to  display  ED Course/ Medical Decision Making/ A&P   {   Click here for ABCD2, HEART and other calculatorsREFRESH Note before signing :1}                              Medical Decision Making Amount and/or Complexity of Data Reviewed Labs: ordered. Radiology: ordered.   ***  {Document critical care time when appropriate:1} {Document review of labs and clinical decision tools ie heart score, Chads2Vasc2 etc:1}  {Document your independent review of radiology images, and any outside records:1} {Document your discussion with family members, caretakers, and with consultants:1} {Document social determinants of health affecting pt's care:1} {Document your decision making why or why not admission, treatments were needed:1} Final Clinical Impression(s) / ED Diagnoses Final diagnoses:  None    Rx / DC Orders ED Discharge Orders     None

## 2023-04-28 ENCOUNTER — Emergency Department (HOSPITAL_COMMUNITY): Payer: Medicare Other

## 2023-04-28 ENCOUNTER — Encounter: Payer: Self-pay | Admitting: Nurse Practitioner

## 2023-04-28 DIAGNOSIS — K219 Gastro-esophageal reflux disease without esophagitis: Secondary | ICD-10-CM

## 2023-04-28 DIAGNOSIS — R1110 Vomiting, unspecified: Secondary | ICD-10-CM | POA: Diagnosis not present

## 2023-04-28 LAB — COMPREHENSIVE METABOLIC PANEL
ALT: 14 U/L (ref 0–44)
AST: 19 U/L (ref 15–41)
Albumin: 3.6 g/dL (ref 3.5–5.0)
Alkaline Phosphatase: 73 U/L (ref 38–126)
Anion gap: 10 (ref 5–15)
BUN: 22 mg/dL (ref 8–23)
CO2: 24 mmol/L (ref 22–32)
Calcium: 9.4 mg/dL (ref 8.9–10.3)
Chloride: 104 mmol/L (ref 98–111)
Creatinine, Ser: 0.93 mg/dL (ref 0.44–1.00)
GFR, Estimated: 57 mL/min — ABNORMAL LOW (ref >60)
Glucose, Bld: 92 mg/dL (ref 70–99)
Potassium: 3.9 mmol/L (ref 3.5–5.1)
Sodium: 138 mmol/L (ref 135–145)
Total Bilirubin: 0.5 mg/dL (ref <1.2)
Total Protein: 7.5 g/dL (ref 6.5–8.1)

## 2023-04-28 LAB — LIPASE, BLOOD: Lipase: 32 U/L (ref 11–51)

## 2023-04-28 NOTE — ED Notes (Signed)
[facility] contacted for transport back to [place].

## 2023-04-28 NOTE — Discharge Instructions (Addendum)
Imaging and labs today were normal, no injuries noted. Follow-up with your primary care doctor. Return here for new concerns.

## 2023-04-29 MED ORDER — OMEPRAZOLE 40 MG PO CPDR: 40.0000 mg | DELAYED_RELEASE_CAPSULE | Freq: Every day | ORAL | 0 refills | Status: DC

## 2023-05-14 ENCOUNTER — Other Ambulatory Visit: Payer: Self-pay | Admitting: Nurse Practitioner

## 2023-05-14 DIAGNOSIS — K219 Gastro-esophageal reflux disease without esophagitis: Secondary | ICD-10-CM

## 2023-05-14 NOTE — Telephone Encounter (Signed)
Will send to Sharon Seller, NP to review and advise due to dispense number only being 5 previously

## 2023-05-15 IMAGING — CT CT HEAD W/O CM
3 of 4 series · 14 of 47 positions shown, 16 images · non-contrast
Comparison: None.

CLINICAL DATA: Fall, facial trauma

EXAM:
CT HEAD WITHOUT CONTRAST
CT CERVICAL SPINE WITHOUT CONTRAST
TECHNIQUE: Multidetector CT imaging of the head and cervical spine was
performed following the standard protocol without intravenous
contrast. Multiplanar CT image reconstructions of the cervical spine
were also generated.

[Series 2: head 5.0 h30s · axial · 0.45mm/px · z∈[-272,-82]mm · 8 of 44 slices shown, 10 images]
[im 3/44  brain]
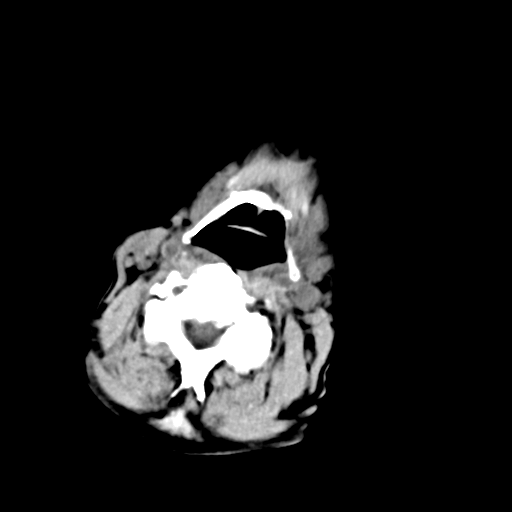
[im 3/44  bone]
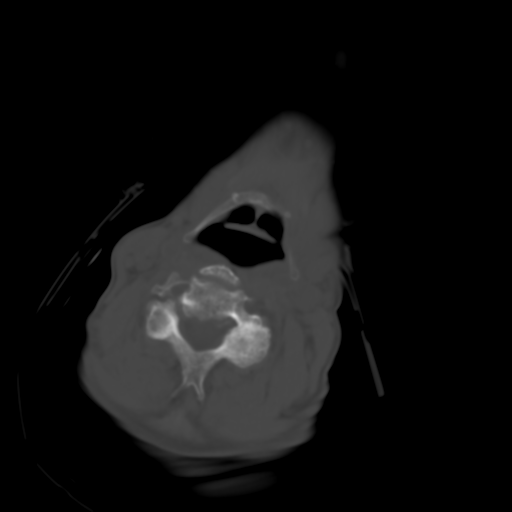
[im 9/44  brain]
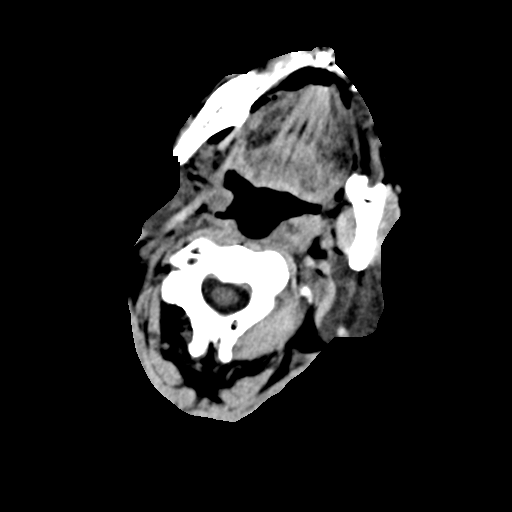
[im 15/44  brain]
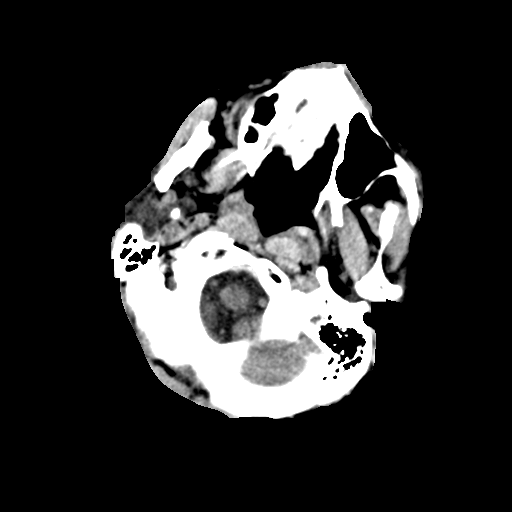
[im 21/44  brain]
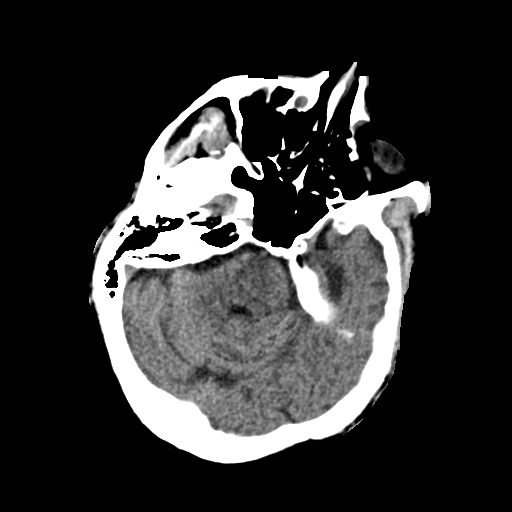
[im 23/44  brain]
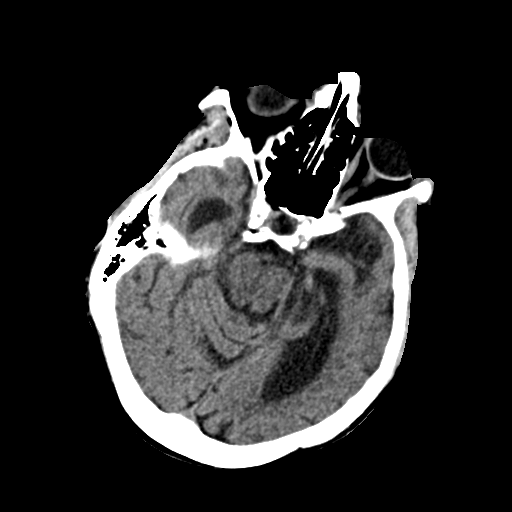
[im 23/44  bone]
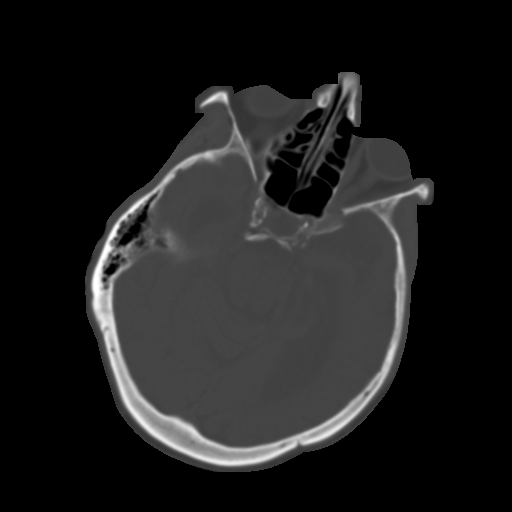
[im 29/44  brain]
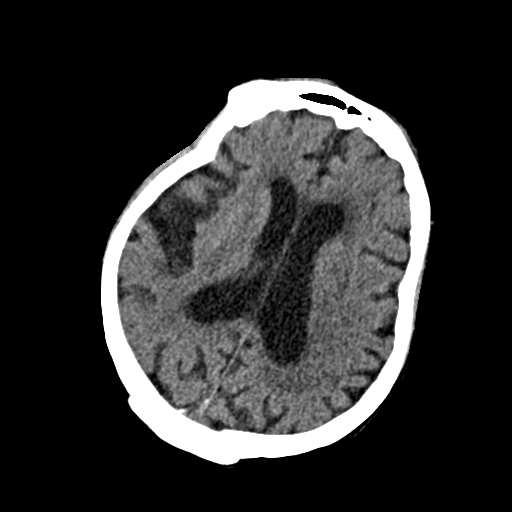
[im 35/44  brain]
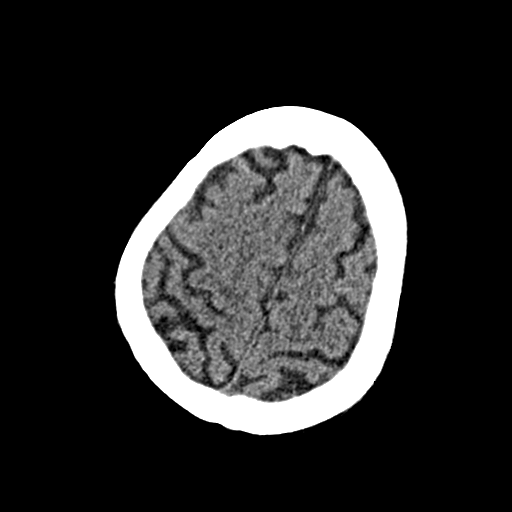
[im 41/44  brain]
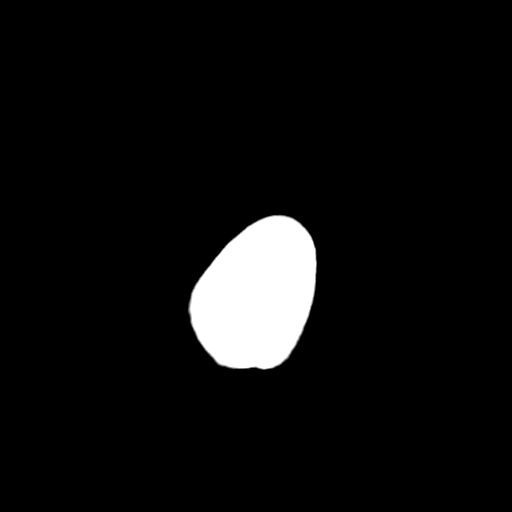

[Series 4: head 3.0 mpr cor · coronal · 0.38mm/px · 3 of 70 slices shown]
[im 24/70  brain]
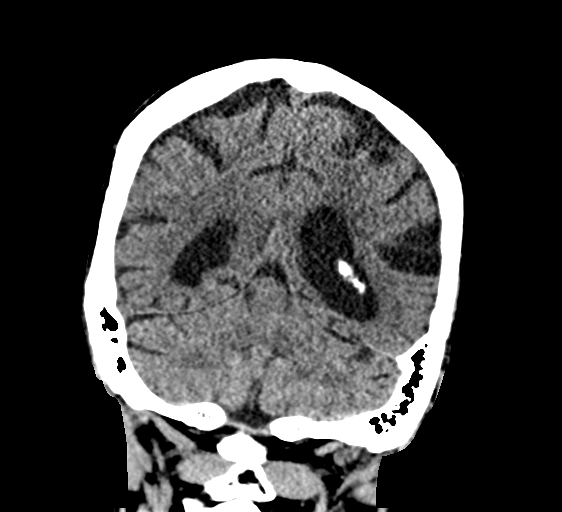
[im 31/70  brain]
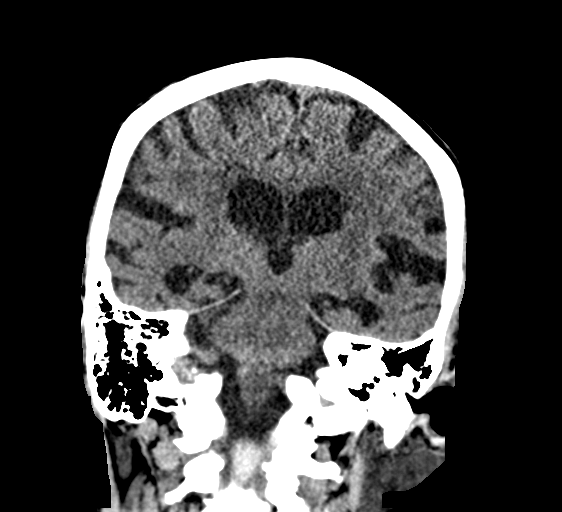
[im 39/70  brain]
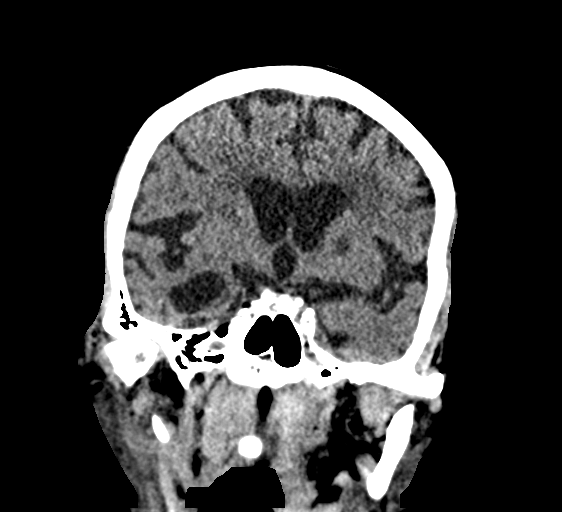

[Series 5: head 3.0 mpr sag · sagittal · 0.44mm/px · 3 of 66 slices shown]
[im 22/66  brain]
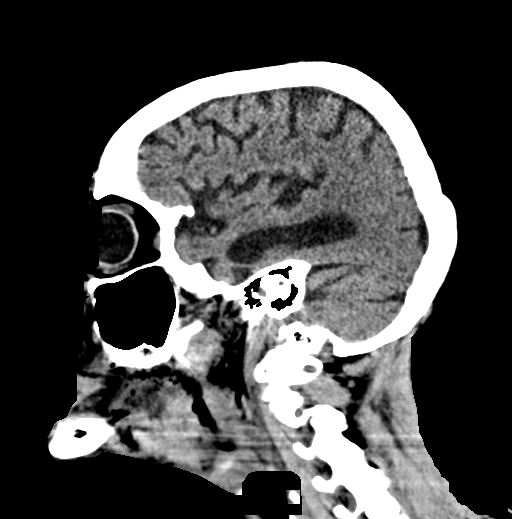
[im 33/66  brain]
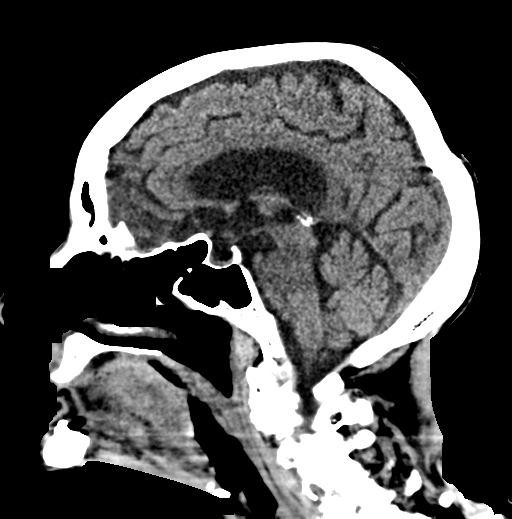
[im 44/66  brain]
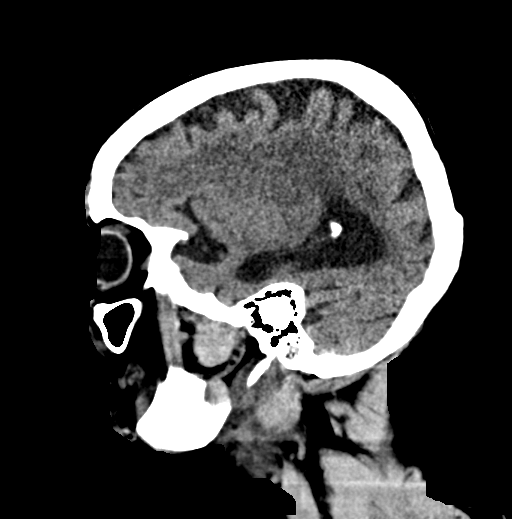

[14 of 47 positions shown; findings below may reference images not displayed]

FINDINGS: CT HEAD FINDINGS

Brain: Normal anatomic configuration. Parenchymal volume loss is
commensurate with the patient's age. Mild periventricular white
matter changes are present likely reflecting the sequela of small
vessel ischemia. Remote lacunar infarct noted within the left basal
ganglia and anterior limb of the left internal capsule as well as
left corona radiata. No abnormal intra or extra-axial mass lesion or
fluid collection. No abnormal mass effect or midline shift. No
evidence of acute intracranial hemorrhage or infarct. Ventricular
size is normal. Cerebellum unremarkable.

Vascular: No asymmetric hyperdense vasculature at the skull base.

Skull: Intact

Sinuses/Orbits: Paranasal sinuses are clear. Orbits are
unremarkable.

Other: Mastoid air cells and middle ear cavities are clear.

CT CERVICAL SPINE FINDINGS

Alignment: Normal cervical lordosis.  No listhesis.

Skull base and vertebrae: The craniocervical alignment is normal.
The atlantodental interval is not widened. There is no acute
fracture of the cervical spine.

Soft tissues and spinal canal: Posterior disc herniation at C5-6
results in abutment and mild narrowing of the central canal with
eccentric flattening of the thecal sac more severe on the left. The
spinal canal is otherwise widely patent. No canal hematoma. The
prevertebral soft tissues are not thickened. No paraspinal fluid
collections are identified.

Disc levels: There is intervertebral disc space narrowing and
endplate remodeling of C4-C7 in keeping with changes of mild to
moderate degenerative disc disease. Milder degenerative changes are
seen at C3-4 and C7-T1. Moderate degenerative changes are
incidentally noted at T1-T2. Sagittal reformats demonstrate no
thickening of the prevertebral soft tissues. Review of the axial
images demonstrates multilevel uncovertebral and facet arthrosis
resulting in severe left neuroforaminal narrowing at C3-4, moderate
bilateral neuroforaminal narrowing at C4-5, moderate to severe left
and moderate right neuroforaminal narrowing at C5-6, mild to
moderate bilateral neuroforaminal narrowing at C6-7.

Upper chest: Unremarkable

Other: Moderate atherosclerotic calcification within the right
carotid bulb.
IMPRESSION: No acute intracranial injury.  No calvarial fracture.

No acute fracture or listhesis of the cervical spine.

Multilevel degenerative disc and degenerative joint disease
resulting in eccentric mild central canal stenosis at C5-6 and
multilevel moderate to severe neuroforaminal narrowing at C3-C7.

Moderate right carotid bifurcation calcification.

## 2023-05-22 ENCOUNTER — Telehealth: Payer: Self-pay

## 2023-05-22 NOTE — Telephone Encounter (Signed)
Noted, I have not seen an FL2 on her yet.

## 2023-05-22 NOTE — Telephone Encounter (Signed)
Benetta Spar is calling from [facility] and need a Fl2 for the patient. Patient states she has made multiple attempts to fax it over so she will come in person just to have it signed.

## 2023-05-26 NOTE — Telephone Encounter (Signed)
Message routed to PCP Eubanks, Jessica K, NP  

## 2023-06-03 ENCOUNTER — Other Ambulatory Visit: Payer: Self-pay | Admitting: Nurse Practitioner

## 2023-06-03 DIAGNOSIS — G301 Alzheimer's disease with late onset: Secondary | ICD-10-CM

## 2023-06-05 ENCOUNTER — Encounter: Payer: Medicare Other | Admitting: Nurse Practitioner
